# Patient Record
Sex: Female | Born: 1940 | Race: White | Hispanic: No | Marital: Married | Smoking: Never smoker
Health system: Southern US, Community
[De-identification: ages and names within clinical notes are randomized; demographics above are authoritative.]

## PROBLEM LIST (undated history)

## (undated) DIAGNOSIS — Z853 Personal history of malignant neoplasm of breast: Secondary | ICD-10-CM

## (undated) DIAGNOSIS — C73 Malignant neoplasm of thyroid gland: Secondary | ICD-10-CM

## (undated) DIAGNOSIS — I10 Essential (primary) hypertension: Secondary | ICD-10-CM

## (undated) DIAGNOSIS — C50919 Malignant neoplasm of unspecified site of unspecified female breast: Secondary | ICD-10-CM

## (undated) DIAGNOSIS — M810 Age-related osteoporosis without current pathological fracture: Secondary | ICD-10-CM

## (undated) DIAGNOSIS — F411 Generalized anxiety disorder: Principal | ICD-10-CM

## (undated) DIAGNOSIS — E039 Hypothyroidism, unspecified: Secondary | ICD-10-CM

## (undated) DIAGNOSIS — Z8639 Personal history of other endocrine, nutritional and metabolic disease: Secondary | ICD-10-CM

## (undated) DIAGNOSIS — E890 Postprocedural hypothyroidism: Secondary | ICD-10-CM

## (undated) DIAGNOSIS — Z90710 Acquired absence of both cervix and uterus: Secondary | ICD-10-CM

## (undated) DIAGNOSIS — F419 Anxiety disorder, unspecified: Secondary | ICD-10-CM

## (undated) HISTORY — PX: CATARACT EXTRACTION, BILATERAL: SHX1313

## (undated) HISTORY — DX: Personal history of other endocrine, nutritional and metabolic disease: Z86.39

## (undated) HISTORY — PX: HYSTERECTOMY ABDOMINAL WITH SALPINGECTOMY: SHX6725

## (undated) HISTORY — PX: OOPHORECTOMY: SHX86

## (undated) HISTORY — PX: TOTAL SHOULDER REPLACEMENT: SUR1217

## (undated) HISTORY — PX: THYROID SURGERY: SHX805

## (undated) HISTORY — DX: Malignant neoplasm of unspecified site of unspecified female breast: C50.919

## (undated) HISTORY — DX: Hypothyroidism, unspecified: E03.9

## (undated) HISTORY — PX: OTHER SURGICAL HISTORY: SHX169

## (undated) HISTORY — DX: Acquired absence of both cervix and uterus: Z90.710

## (undated) HISTORY — DX: Essential (primary) hypertension: I10

## (undated) HISTORY — DX: Anxiety disorder, unspecified: F41.9

## (undated) HISTORY — DX: Personal history of malignant neoplasm of breast: Z85.3

## (undated) HISTORY — DX: Age-related osteoporosis without current pathological fracture: M81.0

## (undated) HISTORY — PX: NERVE REPAIR: SHX2083

## (undated) HISTORY — DX: Malignant neoplasm of thyroid gland: C73

## (undated) HISTORY — DX: Postprocedural hypothyroidism: E89.0

## (undated) HISTORY — DX: Generalized anxiety disorder: F41.1

## (undated) HISTORY — PX: APPENDECTOMY: SHX54

---

## 2016-05-16 DIAGNOSIS — M25569 Pain in unspecified knee: Secondary | ICD-10-CM | POA: Insufficient documentation

## 2017-08-24 DIAGNOSIS — M2141 Flat foot [pes planus] (acquired), right foot: Secondary | ICD-10-CM | POA: Diagnosis not present

## 2017-08-24 DIAGNOSIS — M76821 Posterior tibial tendinitis, right leg: Secondary | ICD-10-CM | POA: Diagnosis not present

## 2017-08-28 DIAGNOSIS — M810 Age-related osteoporosis without current pathological fracture: Secondary | ICD-10-CM | POA: Diagnosis not present

## 2017-08-28 DIAGNOSIS — M1990 Unspecified osteoarthritis, unspecified site: Secondary | ICD-10-CM | POA: Diagnosis not present

## 2017-08-28 DIAGNOSIS — E890 Postprocedural hypothyroidism: Secondary | ICD-10-CM | POA: Diagnosis not present

## 2017-08-28 DIAGNOSIS — C50919 Malignant neoplasm of unspecified site of unspecified female breast: Secondary | ICD-10-CM | POA: Diagnosis not present

## 2017-08-28 DIAGNOSIS — Z9013 Acquired absence of bilateral breasts and nipples: Secondary | ICD-10-CM | POA: Diagnosis not present

## 2017-08-28 DIAGNOSIS — Z791 Long term (current) use of non-steroidal anti-inflammatories (NSAID): Secondary | ICD-10-CM | POA: Diagnosis not present

## 2017-08-28 DIAGNOSIS — Z79811 Long term (current) use of aromatase inhibitors: Secondary | ICD-10-CM | POA: Diagnosis not present

## 2017-08-28 DIAGNOSIS — Z79899 Other long term (current) drug therapy: Secondary | ICD-10-CM | POA: Diagnosis not present

## 2017-08-28 DIAGNOSIS — D0512 Intraductal carcinoma in situ of left breast: Secondary | ICD-10-CM | POA: Diagnosis not present

## 2017-08-28 DIAGNOSIS — Z170 Estrogen receptor positive status [ER+]: Secondary | ICD-10-CM | POA: Diagnosis not present

## 2017-08-28 DIAGNOSIS — Z9882 Breast implant status: Secondary | ICD-10-CM | POA: Diagnosis not present

## 2017-08-28 DIAGNOSIS — I10 Essential (primary) hypertension: Secondary | ICD-10-CM | POA: Diagnosis not present

## 2017-08-28 DIAGNOSIS — Z85850 Personal history of malignant neoplasm of thyroid: Secondary | ICD-10-CM | POA: Diagnosis not present

## 2017-10-07 DIAGNOSIS — E890 Postprocedural hypothyroidism: Secondary | ICD-10-CM | POA: Diagnosis not present

## 2017-10-07 DIAGNOSIS — K219 Gastro-esophageal reflux disease without esophagitis: Secondary | ICD-10-CM | POA: Diagnosis not present

## 2017-10-07 DIAGNOSIS — I10 Essential (primary) hypertension: Secondary | ICD-10-CM | POA: Diagnosis not present

## 2017-10-07 DIAGNOSIS — D0500 Lobular carcinoma in situ of unspecified breast: Secondary | ICD-10-CM | POA: Diagnosis not present

## 2017-10-07 DIAGNOSIS — E041 Nontoxic single thyroid nodule: Secondary | ICD-10-CM | POA: Diagnosis not present

## 2017-10-07 DIAGNOSIS — Z6828 Body mass index (BMI) 28.0-28.9, adult: Secondary | ICD-10-CM | POA: Diagnosis not present

## 2017-10-27 DIAGNOSIS — R05 Cough: Secondary | ICD-10-CM | POA: Diagnosis not present

## 2017-10-27 DIAGNOSIS — I10 Essential (primary) hypertension: Secondary | ICD-10-CM | POA: Diagnosis not present

## 2017-10-27 DIAGNOSIS — J329 Chronic sinusitis, unspecified: Secondary | ICD-10-CM | POA: Diagnosis not present

## 2017-10-27 DIAGNOSIS — R0982 Postnasal drip: Secondary | ICD-10-CM | POA: Diagnosis not present

## 2017-10-29 DIAGNOSIS — M76821 Posterior tibial tendinitis, right leg: Secondary | ICD-10-CM | POA: Diagnosis not present

## 2017-11-07 DIAGNOSIS — S96911A Strain of unspecified muscle and tendon at ankle and foot level, right foot, initial encounter: Secondary | ICD-10-CM | POA: Diagnosis not present

## 2017-11-11 DIAGNOSIS — M2141 Flat foot [pes planus] (acquired), right foot: Secondary | ICD-10-CM | POA: Diagnosis not present

## 2017-11-11 DIAGNOSIS — M76821 Posterior tibial tendinitis, right leg: Secondary | ICD-10-CM | POA: Diagnosis not present

## 2017-11-30 DIAGNOSIS — J0111 Acute recurrent frontal sinusitis: Secondary | ICD-10-CM | POA: Diagnosis not present

## 2017-11-30 DIAGNOSIS — H9201 Otalgia, right ear: Secondary | ICD-10-CM | POA: Diagnosis not present

## 2017-11-30 DIAGNOSIS — I10 Essential (primary) hypertension: Secondary | ICD-10-CM | POA: Diagnosis not present

## 2017-12-23 DIAGNOSIS — M76821 Posterior tibial tendinitis, right leg: Secondary | ICD-10-CM | POA: Diagnosis not present

## 2017-12-29 DIAGNOSIS — J320 Chronic maxillary sinusitis: Secondary | ICD-10-CM | POA: Diagnosis not present

## 2017-12-29 DIAGNOSIS — H65191 Other acute nonsuppurative otitis media, right ear: Secondary | ICD-10-CM | POA: Diagnosis not present

## 2018-01-20 DIAGNOSIS — H90A21 Sensorineural hearing loss, unilateral, right ear, with restricted hearing on the contralateral side: Secondary | ICD-10-CM | POA: Diagnosis not present

## 2018-01-20 DIAGNOSIS — H903 Sensorineural hearing loss, bilateral: Secondary | ICD-10-CM | POA: Diagnosis not present

## 2018-01-20 DIAGNOSIS — H90A22 Sensorineural hearing loss, unilateral, left ear, with restricted hearing on the contralateral side: Secondary | ICD-10-CM | POA: Diagnosis not present

## 2018-01-20 DIAGNOSIS — M26639 Articular disc disorder of temporomandibular joint, unspecified side: Secondary | ICD-10-CM | POA: Diagnosis not present

## 2018-02-25 DIAGNOSIS — Z79899 Other long term (current) drug therapy: Secondary | ICD-10-CM | POA: Diagnosis not present

## 2018-02-25 DIAGNOSIS — Z79811 Long term (current) use of aromatase inhibitors: Secondary | ICD-10-CM | POA: Diagnosis not present

## 2018-02-25 DIAGNOSIS — M810 Age-related osteoporosis without current pathological fracture: Secondary | ICD-10-CM | POA: Diagnosis not present

## 2018-02-25 DIAGNOSIS — Z9013 Acquired absence of bilateral breasts and nipples: Secondary | ICD-10-CM | POA: Diagnosis not present

## 2018-02-25 DIAGNOSIS — Z9882 Breast implant status: Secondary | ICD-10-CM | POA: Diagnosis not present

## 2018-02-25 DIAGNOSIS — Z85850 Personal history of malignant neoplasm of thyroid: Secondary | ICD-10-CM | POA: Diagnosis not present

## 2018-02-25 DIAGNOSIS — D0512 Intraductal carcinoma in situ of left breast: Secondary | ICD-10-CM | POA: Diagnosis not present

## 2018-02-25 DIAGNOSIS — I10 Essential (primary) hypertension: Secondary | ICD-10-CM | POA: Diagnosis not present

## 2018-02-25 DIAGNOSIS — C50919 Malignant neoplasm of unspecified site of unspecified female breast: Secondary | ICD-10-CM | POA: Diagnosis not present

## 2018-02-25 DIAGNOSIS — Z791 Long term (current) use of non-steroidal anti-inflammatories (NSAID): Secondary | ICD-10-CM | POA: Diagnosis not present

## 2018-02-25 DIAGNOSIS — M1990 Unspecified osteoarthritis, unspecified site: Secondary | ICD-10-CM | POA: Diagnosis not present

## 2018-02-25 DIAGNOSIS — E890 Postprocedural hypothyroidism: Secondary | ICD-10-CM | POA: Diagnosis not present

## 2018-02-25 DIAGNOSIS — M8580 Other specified disorders of bone density and structure, unspecified site: Secondary | ICD-10-CM | POA: Diagnosis not present

## 2018-02-26 DIAGNOSIS — M79671 Pain in right foot: Secondary | ICD-10-CM | POA: Diagnosis not present

## 2018-02-26 DIAGNOSIS — M76821 Posterior tibial tendinitis, right leg: Secondary | ICD-10-CM | POA: Diagnosis not present

## 2018-03-26 DIAGNOSIS — H26493 Other secondary cataract, bilateral: Secondary | ICD-10-CM | POA: Diagnosis not present

## 2018-03-26 DIAGNOSIS — H43391 Other vitreous opacities, right eye: Secondary | ICD-10-CM | POA: Diagnosis not present

## 2018-03-26 DIAGNOSIS — H43811 Vitreous degeneration, right eye: Secondary | ICD-10-CM | POA: Diagnosis not present

## 2018-04-23 DIAGNOSIS — M810 Age-related osteoporosis without current pathological fracture: Secondary | ICD-10-CM | POA: Diagnosis not present

## 2018-04-23 DIAGNOSIS — M1990 Unspecified osteoarthritis, unspecified site: Secondary | ICD-10-CM | POA: Diagnosis not present

## 2018-04-23 DIAGNOSIS — N3281 Overactive bladder: Secondary | ICD-10-CM | POA: Diagnosis not present

## 2018-04-23 DIAGNOSIS — C73 Malignant neoplasm of thyroid gland: Secondary | ICD-10-CM | POA: Diagnosis not present

## 2018-04-23 DIAGNOSIS — C50919 Malignant neoplasm of unspecified site of unspecified female breast: Secondary | ICD-10-CM | POA: Diagnosis not present

## 2018-04-23 DIAGNOSIS — I119 Hypertensive heart disease without heart failure: Secondary | ICD-10-CM | POA: Diagnosis not present

## 2018-04-23 DIAGNOSIS — E559 Vitamin D deficiency, unspecified: Secondary | ICD-10-CM | POA: Diagnosis not present

## 2018-04-23 DIAGNOSIS — E041 Nontoxic single thyroid nodule: Secondary | ICD-10-CM | POA: Diagnosis not present

## 2018-04-23 DIAGNOSIS — E039 Hypothyroidism, unspecified: Secondary | ICD-10-CM | POA: Diagnosis not present

## 2018-05-21 DIAGNOSIS — E210 Primary hyperparathyroidism: Secondary | ICD-10-CM | POA: Diagnosis not present

## 2018-05-21 DIAGNOSIS — E042 Nontoxic multinodular goiter: Secondary | ICD-10-CM | POA: Diagnosis not present

## 2018-05-21 DIAGNOSIS — E890 Postprocedural hypothyroidism: Secondary | ICD-10-CM | POA: Diagnosis not present

## 2018-05-21 DIAGNOSIS — Z6828 Body mass index (BMI) 28.0-28.9, adult: Secondary | ICD-10-CM | POA: Diagnosis not present

## 2018-05-21 DIAGNOSIS — Z85850 Personal history of malignant neoplasm of thyroid: Secondary | ICD-10-CM | POA: Diagnosis not present

## 2018-05-21 DIAGNOSIS — Z08 Encounter for follow-up examination after completed treatment for malignant neoplasm: Secondary | ICD-10-CM | POA: Diagnosis not present

## 2018-05-21 DIAGNOSIS — E559 Vitamin D deficiency, unspecified: Secondary | ICD-10-CM | POA: Diagnosis not present

## 2018-05-21 DIAGNOSIS — C73 Malignant neoplasm of thyroid gland: Secondary | ICD-10-CM | POA: Diagnosis not present

## 2018-05-26 DIAGNOSIS — I119 Hypertensive heart disease without heart failure: Secondary | ICD-10-CM | POA: Diagnosis not present

## 2018-05-26 DIAGNOSIS — F419 Anxiety disorder, unspecified: Secondary | ICD-10-CM | POA: Diagnosis not present

## 2018-05-31 DIAGNOSIS — R197 Diarrhea, unspecified: Secondary | ICD-10-CM | POA: Diagnosis not present

## 2018-05-31 DIAGNOSIS — Z6827 Body mass index (BMI) 27.0-27.9, adult: Secondary | ICD-10-CM | POA: Diagnosis not present

## 2018-05-31 DIAGNOSIS — R112 Nausea with vomiting, unspecified: Secondary | ICD-10-CM | POA: Diagnosis not present

## 2018-06-15 DIAGNOSIS — Z23 Encounter for immunization: Secondary | ICD-10-CM | POA: Diagnosis not present

## 2018-07-20 DIAGNOSIS — Z8639 Personal history of other endocrine, nutritional and metabolic disease: Secondary | ICD-10-CM | POA: Diagnosis not present

## 2018-07-20 DIAGNOSIS — C73 Malignant neoplasm of thyroid gland: Secondary | ICD-10-CM

## 2018-07-20 DIAGNOSIS — E890 Postprocedural hypothyroidism: Secondary | ICD-10-CM | POA: Diagnosis not present

## 2018-07-20 DIAGNOSIS — Z85850 Personal history of malignant neoplasm of thyroid: Secondary | ICD-10-CM | POA: Insufficient documentation

## 2018-07-20 DIAGNOSIS — M810 Age-related osteoporosis without current pathological fracture: Secondary | ICD-10-CM | POA: Diagnosis not present

## 2018-07-20 HISTORY — DX: Malignant neoplasm of thyroid gland: C73

## 2018-07-20 HISTORY — DX: Personal history of other endocrine, nutritional and metabolic disease: Z86.39

## 2018-08-02 ENCOUNTER — Ambulatory Visit (INDEPENDENT_AMBULATORY_CARE_PROVIDER_SITE_OTHER): Payer: Medicare Other | Admitting: Osteopathic Medicine

## 2018-08-02 ENCOUNTER — Encounter: Payer: Self-pay | Admitting: Osteopathic Medicine

## 2018-08-02 VITALS — BP 136/77 | HR 92 | Temp 98.4°F | Ht 63.0 in | Wt 161.6 lb

## 2018-08-02 DIAGNOSIS — Z853 Personal history of malignant neoplasm of breast: Secondary | ICD-10-CM

## 2018-08-02 DIAGNOSIS — E890 Postprocedural hypothyroidism: Secondary | ICD-10-CM

## 2018-08-02 DIAGNOSIS — F411 Generalized anxiety disorder: Secondary | ICD-10-CM

## 2018-08-02 DIAGNOSIS — Z90710 Acquired absence of both cervix and uterus: Secondary | ICD-10-CM

## 2018-08-02 DIAGNOSIS — M810 Age-related osteoporosis without current pathological fracture: Secondary | ICD-10-CM

## 2018-08-02 DIAGNOSIS — Z23 Encounter for immunization: Secondary | ICD-10-CM

## 2018-08-02 MED ORDER — ESCITALOPRAM OXALATE 5 MG PO TABS: 5.0000 mg | ORAL_TABLET | Freq: Every day | ORAL | 1 refills | Status: DC

## 2018-08-02 NOTE — Progress Notes (Signed)
HPI: Barbara Simpson is a 77 y.o. female who  has a past medical history of Anxiety, Breast cancer ([place]), High blood pressure, and Hypothyroidism.  she presents to [facility] today, 08/02/18,  for chief complaint of: New to establish Anxiety/Depression   Concern for anxiety and feeling on edge, some depression as well. See below for GAD/PHQ. Previously on Xanax but would like to avoid anything habit-forming.   Depression screen PHQ 2/9 08/02/2018  Decreased Interest 3  Down, Depressed, Hopeless 1  PHQ - 2 Score 4  Altered sleeping 2  Tired, decreased energy 3  Change in appetite 1  Feeling bad or failure about yourself  2  Trouble concentrating 1  Moving slowly or fidgety/restless 0  Suicidal thoughts 0  PHQ-9 Score 13  Difficult doing work/chores Somewhat difficult   GAD 7 : Generalized Anxiety Score 08/02/2018  Nervous, Anxious, on Edge 2  Control/stop worrying 3  Worry too much - different things 3  Trouble relaxing 2  Restless 1  Easily annoyed or irritable 2  Afraid - awful might happen 0  Total GAD 7 Score 13  Anxiety Difficulty Somewhat difficult      Hypothyroid: stable on Synthroid  Hx breast cancer: taking anastrozole  Hx HTN: taking carvedilol   Other supplements: D3, Mg, B12  Constipation: takes stool softeners daily   Never smoker.   S/p hysterectomy.   Drug intolerances: Xylocaine/Epi caused, Sulfa causes rash, PCN causes headaches, Risedronate caused skin growth, Paxil caused palpitations.        Past medical, surgical, social and family history reviewed:  Patient Active Problem List   Diagnosis Date Noted  . Generalized anxiety disorder 08/03/2018  . 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 History of breast cancer 08/03/2018  . Age-related osteoporosis without current pathological fracture 08/03/2018  . H/O total hysterectomy 08/03/2018  . History of primary hyperparathyroidism 07/20/2018  .  Thyroid cancer ([place]) 07/20/2018    Past Surgical History:  Procedure Laterality Date  . APPENDECTOMY    . BROKEN BONE REPAIR    . CATARACT EXTRACTION, BILATERAL    . HYSTERECTOMY ABDOMINAL WITH SALPINGECTOMY    . NERVE REPAIR     LEFT ARM  . OOPHORECTOMY    . THYROID SURGERY    . TOTAL SHOULDER REPLACEMENT Right     Social History   Tobacco Use  . Smoking status: Never Smoker  . Smokeless tobacco: Never Used  Substance Use Topics  . Alcohol use: Never    Frequency: Never    History reviewed. No pertinent family history.   Current medication list and allergy/intolerance information reviewed:    Current Outpatient Medications  Medication Sig Dispense Refill  . anastrozole (ARIMIDEX) 1 MG tablet Take by mouth.    . carvedilol (COREG) 12.5 MG tablet Take by mouth.    . Cholecalciferol (VITAMIN D3) 50 MCG (2000 UT) TABS Take by mouth.    . docusate sodium (COLACE) 100 MG capsule Take by mouth.    . levothyroxine (SYNTHROID, LEVOTHROID) 50 MCG tablet Take by mouth.    . magnesium oxide (MAG-OX) 400 MG tablet Take by mouth.    . vitamin B-12 (CYANOCOBALAMIN) 1000 MCG tablet Take by mouth.       30 tablet 1   No current facility-administered medications for this visit.     Allergies  Allergen Reactions  . Lidocaine-Epinephrine Other (See Comments)    Patient states "nearly died"  . Paroxetine Palpitations  . Penicillins Other (See Comments)  Headache, blood pressure drops  . Sulfa Antibiotics Rash    Blotches  . Risedronate     Skin growths      Review of Systems:  Constitutional:  No  fever, no chills, No recent illness, No unintentional weight changes. +significant fatigue.   HEENT: No  headache, no vision change, no hearing change, No sore throat, No  sinus pressure  Cardiac: No  chest pain, No  pressure, No palpitations, No  Orthopnea  Respiratory:  No  shortness of breath. No  Cough  Gastrointestinal: No  abdominal pain, No  nausea, No  vomiting,  No   blood in stool, No  diarrhea, No  constipation   Musculoskeletal: No new myalgia/arthralgia  Skin: No  Rash, No other wounds/concerning lesions  Genitourinary: No  incontinence, No  abnormal genital bleeding, No abnormal genital discharge  Hem/Onc: No  easy bruising/bleeding  Endocrine: No cold intolerance,  No heat intolerance. No polyuria/polydipsia/polyphagia   Neurologic: No  weakness, No  dizziness, No  slurred speech/focal weakness/facial droop  Psychiatric: +concerns with depression, +concerns with anxiety, No sleep problems, No mood problems  Exam:  BP 136/77 (BP Location: Left Arm, Patient Position: Sitting, Cuff Size: Normal)   Pulse 92   Temp 98.4 F (36.9 C) (Oral)   Ht 5\' 3"  (1.6 m)   Wt 161 lb 9.6 oz (73.3 kg)   BMI 28.63 kg/m   Constitutional: VS see above. General Appearance: alert, well-developed, well-nourished, NAD  Eyes: Normal lids and conjunctive, non-icteric sclera  Ears, Nose, Mouth, Throat: MMM, Normal external inspection ears/nares/mouth/lips/gums. TM normal bilaterally. Pharynx/tonsils no erythema, no exudate. Nasal mucosa normal.   Neck: No masses, trachea midline. No thyroid enlargement. No tenderness/mass appreciated. No lymphadenopathy  Respiratory: Normal respiratory effort. no wheeze, no rhonchi, no rales  Cardiovascular: S1/S2 normal, no murmur, no rub/gallop auscultated. RRR. No lower extremity edem  Gastrointestinal: Nontender, no masses. No hepatomegaly, no splenomegaly. No hernia appreciated. Bowel sounds normal. Rectal exam deferred.   Musculoskeletal: Gait normal. No clubbing/cyanosis of digits.   Neurological: Normal balance/coordination. No tremor. No cranial nerve deficit on limited exam. Motor and sensation intact and symmetric. Cerebellar reflexes intact.   Skin: warm, dry, intact. No rash/ulcer. No concerning nevi or subq nodules on limited exam.    Psychiatric: Normal judgment/insight. Normal mood and affect. Oriented x3.       ASSESSMENT/PLAN: The primary encounter diagnosis was Generalized anxiety disorder. Diagnoses of Need for shingles vaccine, Postoperative hypothyroidism, History of breast cancer, Age-related osteoporosis without current pathological fracture, and H/O total hysterectomy were also pertinent to this visit.   Orders Placed This Encounter  Procedures  . Ambulatory referral to Hematology / Oncology    Meds ordered this encounter  Medications  . escitalopram (LEXAPRO) 5 MG tablet    Sig: Take 1 tablet (5 mg total) by mouth at bedtime.    Dispense:  30 tablet    Refill:  1  . anastrozole (ARIMIDEX) 1 MG tablet    Sig: Take 1 tablet (1 mg total) by mouth daily.    Dispense:  90 tablet    Refill:  3  . carvedilol (COREG) 12.5 MG tablet    Sig: Take 1 tablet (12.5 mg total) by mouth 2 (two) times daily with a meal.    Dispense:  180 tablet    Refill:  3  . Cholecalciferol (VITAMIN D3) 50 MCG (2000 UT) TABS    Sig: Take 50 mcg by mouth daily.    Dispense:  90 tablet  Refill:  3  . docusate sodium (COLACE) 100 MG capsule    Sig: Take 1 capsule (100 mg total) by mouth daily.    Dispense:  90 capsule    Refill:  3  . levothyroxine (SYNTHROID, LEVOTHROID) 50 MCG tablet    Sig: Take 1 tablet (50 mcg total) by mouth daily before breakfast.    Dispense:  90 tablet    Refill:  3    Patient Instructions  Will try Lexapro low dose for anxiety Let's see you back in the office in about a month to recheck on this medicine See me or call me sooner if needed!  Will refer to oncology.  For orthopedic issues: please schedule with one of our sports medicine doctors.  Continue follow-up with endocrinologist as directed           Visit summary with medication list and pertinent instructions was printed for patient to review. All questions at time of visit were answered - patient instructed to contact office with any additional concerns or updates. ER/RTC precautions were reviewed  with the patient.    Please note: voice recognition software was used to produce this document, and typos may escape review. Please contact Dr. Sheppard Coil for any needed clarifications.     Follow-up plan: Return in about 4 weeks (around 08/30/2018) for recheck anxiety on Lexapro, see me sooner if needed.

## 2018-08-02 NOTE — Patient Instructions (Addendum)
Will try Lexapro low dose for anxiety Let's see you back in the office in about a month to recheck on this medicine See me or call me sooner if needed!  Will refer to oncology.  For orthopedic issues: please schedule with one of our sports medicine doctors.  Continue follow-up with endocrinologist as directed

## 2018-08-03 ENCOUNTER — Encounter: Payer: Self-pay | Admitting: Osteopathic Medicine

## 2018-08-03 ENCOUNTER — Telehealth: Payer: Self-pay | Admitting: Hematology & Oncology

## 2018-08-03 DIAGNOSIS — Z90710 Acquired absence of both cervix and uterus: Secondary | ICD-10-CM

## 2018-08-03 DIAGNOSIS — Z853 Personal history of malignant neoplasm of breast: Secondary | ICD-10-CM

## 2018-08-03 DIAGNOSIS — E890 Postprocedural hypothyroidism: Secondary | ICD-10-CM

## 2018-08-03 DIAGNOSIS — M810 Age-related osteoporosis without current pathological fracture: Secondary | ICD-10-CM | POA: Insufficient documentation

## 2018-08-03 DIAGNOSIS — F411 Generalized anxiety disorder: Secondary | ICD-10-CM

## 2018-08-03 HISTORY — DX: Acquired absence of both cervix and uterus: Z90.710

## 2018-08-03 HISTORY — DX: Personal history of malignant neoplasm of breast: Z85.3

## 2018-08-03 HISTORY — DX: Postprocedural hypothyroidism: E89.0

## 2018-08-03 HISTORY — DX: Generalized anxiety disorder: F41.1

## 2018-08-03 HISTORY — DX: Age-related osteoporosis without current pathological fracture: M81.0

## 2018-08-03 MED ORDER — CARVEDILOL 12.5 MG PO TABS: 12.5000 mg | ORAL_TABLET | Freq: Two times a day (BID) | ORAL | 3 refills | Status: DC

## 2018-08-03 MED ORDER — ANASTROZOLE 1 MG PO TABS: 1.0000 mg | ORAL_TABLET | Freq: Every day | ORAL | 3 refills | Status: DC

## 2018-08-03 MED ORDER — VITAMIN D3 50 MCG (2000 UT) PO TABS: 50.0000 ug | ORAL_TABLET | Freq: Every day | ORAL | 3 refills | Status: AC

## 2018-08-03 MED ORDER — DOCUSATE SODIUM 100 MG PO CAPS: 100.0000 mg | ORAL_CAPSULE | Freq: Every day | ORAL | 3 refills | Status: DC

## 2018-08-03 MED ORDER — LEVOTHYROXINE SODIUM 50 MCG PO TABS: 50.0000 ug | ORAL_TABLET | Freq: Every day | ORAL | 3 refills | Status: DC

## 2018-08-03 NOTE — Telephone Encounter (Signed)
Spoke with patient to confirm new patient appt 09/02/18 at 1030 am

## 2018-08-05 ENCOUNTER — Ambulatory Visit (INDEPENDENT_AMBULATORY_CARE_PROVIDER_SITE_OTHER): Payer: Medicare Other

## 2018-08-05 ENCOUNTER — Encounter: Payer: Self-pay | Admitting: Family Medicine

## 2018-08-05 ENCOUNTER — Ambulatory Visit (INDEPENDENT_AMBULATORY_CARE_PROVIDER_SITE_OTHER): Payer: Medicare Other | Admitting: Family Medicine

## 2018-08-05 VITALS — BP 133/65 | HR 78 | Ht 64.0 in | Wt 162.0 lb

## 2018-08-05 DIAGNOSIS — M25561 Pain in right knee: Secondary | ICD-10-CM | POA: Diagnosis not present

## 2018-08-05 DIAGNOSIS — M25572 Pain in left ankle and joints of left foot: Secondary | ICD-10-CM

## 2018-08-05 DIAGNOSIS — G8929 Other chronic pain: Secondary | ICD-10-CM

## 2018-08-05 DIAGNOSIS — M1711 Unilateral primary osteoarthritis, right knee: Secondary | ICD-10-CM

## 2018-08-05 NOTE — Progress Notes (Signed)
Subjective:    CC: Right knee and left foot pain  HPI: Barbara Simpson is a 77 yr old woman, with a past medical history of breast and thyroid cancer and GAD, who presents today for right knee and left foot pain. She recently moved here from the [place] area.   Right knee: She had been going to Dr. Yehuda Savannah at [place] where she received a series of 3 hyaluronic acid injections about 20 months ago. She feels it "wearing off". She feels popping and clicking when she walks and is only able to take one step at a time when she takes stairs. She has previously had knee x-rays and MRI, and she recalls that they showed loss of cartilage. She would like to have more injections of hyaluronic acid.  She also complains of lateral left foot pain that started recently. She has been wearing a copper compression sock, as recommended by Dr. Yehuda Savannah. Her pain is controlled when she wears the sock, but when she doesn't, she feels a pulling sensation across the lateral side of her foot. She also notes sharp shooting pains on the bottom of her foot that occur at any time. This interferes with her daily life and prevents her from being as active as she would like to be.   Past medical history, Surgical history, Family history not pertinant except as noted below, Social history, Allergies, and medications have been entered into the medical record, reviewed, and no changes needed.   Review of Systems: No headache, visual changes, nausea, vomiting, diarrhea, constipation, dizziness, abdominal pain, skin rash, fevers, chills, night sweats, weight loss, swollen lymph nodes, body aches, joint swelling, muscle aches, chest pain, shortness of breath, mood changes, visual or auditory hallucinations.   Objective:    Vitals:   08/05/18 1017  BP: 133/65  Pulse: 78   General: Well Developed, well nourished, and in no acute distress.  Neuro/Psych: Alert and oriented x3, extra-ocular muscles intact, able to move  all 4 extremities, sensation grossly intact. Skin: Warm and dry, no rashes noted.  Respiratory: Not using accessory muscles, speaking in full sentences, trachea midline.  Cardiovascular: Pulses palpable, no extremity edema. Abdomen: Does not appear distended. MSK:  Right knee:  Normal-appearing.  Tender to palpation on the medial side of the patella. Limited ROM due to pain. Crepitations with range of motion 5/5 strength with knee flexion. Positive McMurray test. Negative anterior and posterior drawer tests.  Pulses and capillary refill normal.  Left foot:  Normal-appearing.  Tender to palpation below lateral epicondyle. Limited ROM due to pain.  4/5 strength with plantar and dorsiflexion, limited to pain. 4/5 strength with eversion and inversion.  Pulses and capillary refill normal.  Lab and Radiology Results X-ray images left ankle reveal no acute fractures.  Mild degenerative changes.  Images personally independently reviewed.  Await formal radiology review.  Impression and Recommendations:    Assessment and Plan: 77 y.o. female with  Right knee pain due to osteoarthritis.  Will plan for hyaluronic acid injections. Contact her insurance to confirm which brand they pay for. [facility] for records and previous images.  Left foot pain: Left foot x-ray did not reveal any obvious fractures or degenerative changes. Advised pt to use an ankle compression sleeve and to use topical aspercream as needed. Follow-up in a few weeks. If not improving, do cortisone injection.   Orders Placed This Encounter  Procedures  . DG Ankle Complete Left    Standing Status:  Future    Number of Occurrences:   1    Standing Expiration Date:   10/07/2019    Order Specific Question:   Reason for Exam (SYMPTOM  OR DIAGNOSIS REQUIRED)    Answer:   eval left lateral ankle pain    Order Specific Question:   Preferred imaging location?    Answer:   [facility]    Order Specific  Question:   Radiology Contrast Protocol - do NOT remove file path    Answer:   [email or web address]   No orders of the defined types were placed in this encounter.   Discussed warning signs or symptoms. Please see discharge instructions. Patient expresses understanding.  I personally was present and performed or re-performed the history, physical exam and medical decision-making activities of this service and have verified that the service and findings are accurately documented in the student's note. ___________________________________________ Lynne Leader M.D., ABFM., CAQSM. Primary Care and Sports Medicine Adjunct Instructor of [employer]

## 2018-08-05 NOTE — Patient Instructions (Signed)
Thank you for coming in today. Use topical aspercream.  Use the compression sleeve.  Consider Body Helix Full Ankle.  Get xray now.  Recheck in a few weeks. If not improving it will make sense to do a steroid (cortisone) shot.   As for the right knee we will proceed with the Gel Shots. (rooster comb).  You should hear about that soon.

## 2018-08-06 ENCOUNTER — Telehealth: Payer: Self-pay | Admitting: Family Medicine

## 2018-08-06 NOTE — Telephone Encounter (Signed)
-----   Message from Tasia Catchings, [place] sent at 08/06/2018  9:15 AM EST ----- Regarding: FW: [facility]ov[facility] Information has been submitted to [facility] and awaiting determination.   ----- Message ----- From: Gregor Hams, MD Sent: 08/05/2018  12:48 PM EST To: Tasia Catchings, CMA Subject: Orthovisc                                      Please prior Auth Orthovisc or other equivalent hyaluronic acid.  Right knee injection.

## 2018-08-09 NOTE — Telephone Encounter (Signed)
Orthovisc is covered. There is no copay. The deductible has been met, and per Medicare's guideline the patient's responsibility is 20% of the allowable amount. The out of pocket does not apply. Per Portal.  Left message for patient to call back about injections.

## 2018-08-12 NOTE — Telephone Encounter (Signed)
Patient is agreeable to the estimated cost of the knee injections. She has an appointment on 08/17/2018 for first right knee injection.

## 2018-08-17 ENCOUNTER — Ambulatory Visit: Payer: Medicare Other | Admitting: Family Medicine

## 2018-08-30 ENCOUNTER — Encounter: Payer: Self-pay | Admitting: Osteopathic Medicine

## 2018-08-30 ENCOUNTER — Ambulatory Visit (INDEPENDENT_AMBULATORY_CARE_PROVIDER_SITE_OTHER): Payer: Medicare Other | Admitting: Osteopathic Medicine

## 2018-08-30 VITALS — BP 130/79 | HR 93 | Temp 97.8°F | Wt 161.0 lb

## 2018-08-30 DIAGNOSIS — Z8659 Personal history of other mental and behavioral disorders: Secondary | ICD-10-CM | POA: Diagnosis not present

## 2018-08-30 DIAGNOSIS — M1711 Unilateral primary osteoarthritis, right knee: Secondary | ICD-10-CM

## 2018-08-30 NOTE — Patient Instructions (Signed)
Will have you back for Medicare Wellness Visit later this year with our nurse Maudie Mercury. And you can come see me same day for physical exam and medical check-up. Otherwise, see [place] as needed! Dr. Georgina Snell and I are here if you need [place]!  I'll look into the Ortho[facility]isc as well.

## 2018-08-30 NOTE — Progress Notes (Signed)
HPI: Barbara Simpson is a 78 y.o. female who  has a past medical history of Age-related osteoporosis without current pathological fracture (08/03/2018), Anxiety, Breast cancer ([place]), Generalized anxiety disorder (08/03/2018), H/O total hysterectomy (08/03/2018), High blood pressure, History of breast cancer (08/03/2018), History of primary hyperparathyroidism (07/20/2018), Hypothyroidism, Postoperative hypothyroidism (08/03/2018), and Thyroid cancer ([place]) (07/20/2018).  she presents to [facility] today, 08/30/18,  for chief complaint of:  Follow-up anxiety   Started Lexapro at low dose 5 mg about a month ago to treat anxiety. Previously on Xanax but wanted to avoid controlled substances.   Pt stopped Lexapro. Slept great but felt quite foggy on the medicine. Stress has subsided a god bit and mentally she feels well.    Depression screen [facility] 2/9 08/30/2018 08/02/2018  Decreased Interest 0 3  Down, Depressed, Hopeless 0 1  PHQ - 2 Score 0 4  Altered sleeping 0 2  Tired, decreased energy 2 3  Change in appetite 0 1  Feeling bad or failure about yourself  0 2  Trouble concentrating 0 1  Moving slowly or fidgety/restless 0 0  Suicidal thoughts - 0  PHQ-9 Score 2 13  Difficult doing work/chores Not difficult at all Somewhat difficult   GAD 7 : Generalized Anxiety Score 08/30/2018 08/02/2018  Nervous, Anxious, on Edge 0 2  Control/stop worrying 1 3  Worry too much - different things 0 3  Trouble relaxing 0 2  Restless 0 1  Easily annoyed or irritable 0 2  Afraid - awful might happen 0 0  Total GAD 7 Score 1 13  Anxiety Difficulty Not difficult at all Somewhat difficult        At today's visit... Past medical history, surgical history, and family history reviewed and updated as needed.  Current medication list and allergy/intolerance information reviewed and updated as needed. (See remainder of HPI, ROS, Phys Exam  below)          ASSESSMENT/PLAN: The primary encounter diagnosis was History of anxiety. A diagnosis of Primary osteoarthritis of right knee was also pertinent to this visit.   Patient Instructions  Will have you back for Medicare Wellness Visit later this year with our nurse Maudie Mercury. And you can come see me same day for physical exam and medical check-up. Otherwise, see [place] as needed! Dr. Georgina Snell and I are here if you need [place]!  I'll look into the OrthoVisc as well.        Follow-up plan: Return for Medicare visit this fall w/ Maudie Mercury.                             ############################################ ############################################ ############################################ ############################################    No outpatient medications have been marked as taking for the 08/30/18 encounter (Appointment) with Emeterio Reeve, DO.    Allergies  Allergen Reactions  . Lidocaine-Epinephrine Other (See Comments)    Patient states "nearly died"  . Paroxetine Palpitations  . Penicillins Other (See Comments)    Headache, blood pressure drops  . Sulfa Antibiotics Rash    Blotches  . Risedronate     Skin growths       Review of Systems:  Constitutional: No recent illness  HEENT: No  headache, no vision change  Cardiac: No  chest pain, No  pressure, No palpitations  Respiratory:  No  shortness of breath. No  Cough  Neurologic: No  weakness, No  Dizziness  Psychiatric: No  concerns with  depression, No  concerns with anxiety  Exam:  BP 130/79   Pulse 93   Temp 97.8 F (36.6 C) (Oral)   Wt 161 lb (73 kg)   BMI 27.64 kg/m   Constitutional: VS see above. General Appearance: alert, well-developed, well-nourished, NAD  Eyes: Normal lids and conjunctive, non-icteric sclera  Ears, Nose, Mouth, Throat: MMM, Normal external inspection ears/nares/mouth/lips/gums.  Neck: No masses, trachea midline.    Respiratory: Normal respiratory effort.   Musculoskeletal: Gait normal. Symmetric and independent movement of all extremities  Neurological: Normal balance/coordination. No tremor.  Skin: warm, dry, intact.   Psychiatric: Normal judgment/insight. Normal mood and affect. Oriented x3.       Visit summary with medication list and pertinent instructions was printed for patient to review, patient was advised to alert [place] if any updates are needed. All questions at time of visit were answered - patient instructed to contact office with any additional concerns. ER/RTC precautions were reviewed with the patient and understanding verbalized.   Note: Total time spent 15 minutes, greater than 50% of the visit was spent face-to-face counseling and coordinating care for the following: The primary encounter diagnosis was History of anxiety. A diagnosis of Primary osteoarthritis of right knee was also pertinent to this visit.Marland Kitchen  Please note: voice recognition software was used to produce this document, and typos may escape review. Please contact Dr. Sheppard Coil for any needed clarifications.    Follow up plan: Return for Medicare visit this fall w/ Maudie Mercury.

## 2018-09-02 ENCOUNTER — Encounter (INDEPENDENT_AMBULATORY_CARE_PROVIDER_SITE_OTHER): Payer: Self-pay

## 2018-09-02 ENCOUNTER — Inpatient Hospital Stay: Payer: Medicare Other | Attending: Hematology & Oncology | Admitting: Hematology & Oncology

## 2018-09-02 ENCOUNTER — Encounter: Payer: Self-pay | Admitting: Hematology & Oncology

## 2018-09-02 ENCOUNTER — Inpatient Hospital Stay: Payer: Medicare Other

## 2018-09-02 ENCOUNTER — Other Ambulatory Visit: Payer: Self-pay

## 2018-09-02 VITALS — BP 130/59 | HR 79 | Resp 18 | Ht 64.0 in | Wt 159.0 lb

## 2018-09-02 DIAGNOSIS — Z853 Personal history of malignant neoplasm of breast: Secondary | ICD-10-CM | POA: Diagnosis not present

## 2018-09-02 DIAGNOSIS — Z85850 Personal history of malignant neoplasm of thyroid: Secondary | ICD-10-CM

## 2018-09-02 DIAGNOSIS — Z90710 Acquired absence of both cervix and uterus: Secondary | ICD-10-CM | POA: Diagnosis not present

## 2018-09-02 DIAGNOSIS — Z79899 Other long term (current) drug therapy: Secondary | ICD-10-CM

## 2018-09-02 DIAGNOSIS — Z9013 Acquired absence of bilateral breasts and nipples: Secondary | ICD-10-CM

## 2018-09-02 DIAGNOSIS — L820 Inflamed seborrheic keratosis: Secondary | ICD-10-CM

## 2018-09-02 LAB — CMP (CANCER CENTER ONLY)
ALT: 8 U/L (ref 0–44)
AST: 9 U/L — ABNORMAL LOW (ref 15–41)
Albumin: 4.2 g/dL (ref 3.5–5.0)
Alkaline Phosphatase: 88 U/L (ref 38–126)
Anion gap: 7 (ref 5–15)
BUN: 11 mg/dL (ref 8–23)
CO2: 32 mmol/L (ref 22–32)
Calcium: 8.8 mg/dL — ABNORMAL LOW (ref 8.9–10.3)
Chloride: 95 mmol/L — ABNORMAL LOW (ref 98–111)
Creatinine: 0.74 mg/dL (ref 0.44–1.00)
GFR, Est AFR Am: >60 mL/min (ref >60)
GFR, Estimated: >60 mL/min (ref >60)
Glucose, Bld: 130 mg/dL — ABNORMAL HIGH (ref 70–99)
Potassium: 4.3 mmol/L (ref 3.5–5.1)
Sodium: 134 mmol/L — ABNORMAL LOW (ref 135–145)
Total Bilirubin: 0.4 mg/dL (ref 0.3–1.2)
Total Protein: 6.6 g/dL (ref 6.5–8.1)

## 2018-09-02 LAB — CBC WITH DIFFERENTIAL (CANCER CENTER ONLY)
Abs Immature Granulocytes: 0.1 10*3/uL — ABNORMAL HIGH (ref 0.00–0.07)
Basophils Absolute: 0.1 10*3/uL (ref 0.0–0.1)
Basophils Relative: 1 %
Eosinophils Absolute: 0.2 10*3/uL (ref 0.0–0.5)
Eosinophils Relative: 3 %
HCT: 39.4 % (ref 36.0–46.0)
Hemoglobin: 12.4 g/dL (ref 12.0–15.0)
Immature Granulocytes: 2 %
Lymphocytes Relative: 16 %
Lymphs Abs: 1.1 10*3/uL (ref 0.7–4.0)
MCH: 27 pg (ref 26.0–34.0)
MCHC: 31.5 g/dL (ref 30.0–36.0)
MCV: 85.8 fL (ref 80.0–100.0)
Monocytes Absolute: 0.5 10*3/uL (ref 0.1–1.0)
Monocytes Relative: 8 %
Neutro Abs: 4.9 10*3/uL (ref 1.7–7.7)
Neutrophils Relative %: 70 %
Platelet Count: 298 10*3/uL (ref 150–400)
RBC: 4.59 MIL/uL (ref 3.87–5.11)
RDW: 14.3 % (ref 11.5–15.5)
WBC Count: 6.8 10*3/uL (ref 4.0–10.5)
nRBC: 0 % (ref 0.0–0.2)

## 2018-09-02 LAB — LACTATE DEHYDROGENASE: LDH: 107 U/L (ref 98–192)

## 2018-09-02 NOTE — Progress Notes (Signed)
Referral MD  Reason for Referral: Breast cancer of the left breast-no information available  Chief Complaint  Patient presents with  . New Patient (Initial Visit)  : I had cancer in my left breast.  HPI: Barbara Simpson is a very nice 78 year old white female.  She is originally from [place].  She has moved quite a bit.  She was down in [place] and then she and her husband moved up to [facility].  She now has moved to the area as her children live here.  She has had multiple surgeries.  She has had breast cancer.  She really cannot give me much information about the breast cancer.  She says it was in the left breast.  She had surgery done in [place] back in 2014.  She said that she did not need any kind of chemotherapy or radiation therapy.  She actually had bilateral mastectomy.  She currently is on Arimidex.  She is done well with Arimidex.  It is causing some issues with her bones.  Since she has been on it now for 5 years, we will get her off the Arimidex.  She said that her oncologist down in [place] says she only needed for 5 years.  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  She had a mastectomy for this.  She has had multiple surgeries for multiple health issues.  She is followed by Dr. Sheppard Coil.  Dr. Sheppard Coil kindly referred her to the [facility] for an oncology surveillance.  Barbara Simpson is fun to talk to.  She is quite energetic.  She tries to stay active.  I think her husband has health issues and he is not doing all that well.  She has had no change in bowel or bladder habits.  She has had no cough.  She does not smoke.  She does not drink.  She does not work currently.  She does not have any kind of occupational exposures.  She had a hysterectomy with her ovaries removed back when she was in her 70s.  She was on estrogen therapy for many years afterwards.  I am sure this probably is the reason for her to have had the  breast cancer.  She has had no rashes.  She has had no issues with skin cancer.  Her appetite is good.  She is not a vegetarian.  Overall, her performance status is ECOG 1.     Past Medical History:  Diagnosis Date  . Age-related osteoporosis without current pathological fracture 08/03/2018  . Anxiety   . Breast cancer ([place])   . Generalized anxiety disorder 08/03/2018  . H/O total hysterectomy 08/03/2018  . High blood pressure   . History of breast cancer 08/03/2018  . History of primary hyperparathyroidism 07/20/2018  . Hypothyroidism   . 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 Thyroid cancer ([place]) 07/20/2018  :  Past Surgical History:  Procedure Laterality Date  . APPENDECTOMY    . BROKEN BONE REPAIR    . CATARACT EXTRACTION, BILATERAL    . HYSTERECTOMY ABDOMINAL WITH SALPINGECTOMY    . NERVE REPAIR     LEFT ARM  . OOPHORECTOMY    . THYROID SURGERY    . TOTAL SHOULDER REPLACEMENT Right   :   Current Outpatient Medications:  Marland Kitchen  Melatonin 5 MG TABS, Take 2.5 mg by mouth as needed. Takes 1/2 tablet, total of 2.5 mg as needed at bedtime., Disp: , Rfl:  .  anastrozole (ARIMIDEX) 1 MG tablet, Take 1 tablet (1 mg total) by mouth daily., Disp: 90 tablet, Rfl: 3 .  carvedilol (COREG) 12.5 MG tablet, Take 1 tablet (12.5 mg total) by mouth 2 (two) times daily with a meal., Disp: 180 tablet, Rfl: 3 .  Cholecalciferol (VITAMIN D3) 50 MCG (2000 UT) TABS, Take 50 mcg by mouth daily., Disp: 90 tablet, Rfl: 3 .  docusate sodium (COLACE) 100 MG capsule, Take 1 capsule (100 mg total) by mouth daily., Disp: 90 capsule, Rfl: 3 .  levothyroxine (SYNTHROID, LEVOTHROID) 50 MCG tablet, Take 1 tablet (50 mcg total) by mouth daily before breakfast., Disp: 90 tablet, Rfl: 3 .  magnesium oxide (MAG-OX) 400 MG tablet, Take by mouth., Disp: , Rfl:  .  vitamin B-12 (CYANOCOBALAMIN) 1000 MCG tablet, Take by mouth., Disp: , Rfl: :  :  Allergies  Allergen Reactions  . Lidocaine-Epinephrine  Other (See Comments)    Patient states "nearly died"  . Paroxetine Palpitations  . Penicillins Other (See Comments)    Headache, blood pressure drops  . Sulfa Antibiotics Rash    Blotches  . Xylocaine  [Lidocaine Hcl]   . Risedronate     Skin growths  :  History reviewed. No pertinent family history.:  Social History   Socioeconomic History  . Marital status: Married    Spouse name: Not on file  . Number of children: Not on file  . Years of education: Not on file  . Highest education level: Not on file  Occupational History  . Not on file  Social Needs  . Financial resource strain: Not on file  . Food insecurity:    Worry: Not on file    Inability: Not on file  . Transportation needs:    Medical: Not on file    Non-medical: Not on file  Tobacco Use  . Smoking status: Never Smoker  . Smokeless tobacco: Never Used  Substance and Sexual Activity  . Alcohol use: Never    Frequency: Never  . Drug use: Never  . Sexual activity: Not Currently    Partners: Male  Lifestyle  . Physical activity:    Days per week: Not on file    Minutes per session: Not on file  . Stress: Not on file  Relationships  . Social connections:    Talks on phone: Not on file    Gets together: Not on file    Attends religious service: Not on file    Active member of club or organization: Not on file    Attends meetings of clubs or organizations: Not on file    Relationship status: Not on file  . Intimate partner violence:    Fear of current or ex partner: Not on file    Emotionally abused: Not on file    Physically abused: Not on file    Forced sexual activity: Not on file  Other Topics Concern  . Not on file  Social History Narrative  . Not on file  :  Review of Systems  Constitutional: Negative.   HENT: Negative.   Eyes: Negative.   Respiratory: Negative.   Cardiovascular: Negative.   Gastrointestinal: Negative.   Genitourinary: Negative.   Musculoskeletal: Negative.   Skin:  Negative.   Neurological: Negative.   Endo/Heme/Allergies: Negative.   Psychiatric/Behavioral: Negative.      Exam: Well-developed and well-nourished white female in no obvious distress.  Vital signs show temperature of 98.7.  Pulse 79.  Blood pressure 130/59.  Weight is  159 pounds.  Head neck exam shows no ocular or oral lesions.  She has no palpable cervical or supraclavicular lymph nodes.  Lungs are clear bilaterally.  Cardiac exam regular rate and rhythm with no murmurs, rubs or bruits.  Abdomen is soft.  She has good bowel sounds.  She has laparotomy scars.  She has no palpable liver or spleen tip.  Breast exam shows bilateral mastectomies.  She has implants.  She has no distinct probable chest wall masses.  She has no tenderness over the sternum.  She has no bilateral axillary adenopathy.  Back exam shows no tenderness over the spine, ribs or hips.  Extremities shows no clubbing, cyanosis or edema.  Neurological exam shows no focal neurological deficits.  Skin exam shows some seborrheic keratoses.  She has a large one on the back of her left upper arm that probably needs to be removed. @IPVITALS @   Recent Labs    09/02/18 1028  WBC 6.8  HGB 12.4  HCT 39.4  PLT 298   Recent Labs    09/02/18 1028  NA 134*  K 4.3  CL 95*  CO2 32  GLUCOSE 130*  BUN 11  CREATININE 0.74  CALCIUM 8.8*    Blood smear review: None  Pathology: None    Assessment and Plan: Barbara Simpson is a very charming 78 year old  postmenopausal white female.  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  Again, we have absolutely  no pathologic information.  I am just relying on Barbara Simpson who I think is a very good historian.  I really do not think that she is going to be at high risk for any type of breast cancer recurrence.  She will need to follow-up in 6 months.  Again, we will get her off the Arimidex.  As far as the skin issues go, she needs to have a dermatologist see her.  We will  see about making her a referral to dermatology.  I will plan to get her back in 6 months.  I spent about an hour with her today.  I was face-to-face with her.  I answered all of her questions.  It was fun talking to her about all the places that she has lived.

## 2018-09-03 ENCOUNTER — Telehealth: Payer: Self-pay | Admitting: Hematology & Oncology

## 2018-09-03 LAB — CANCER ANTIGEN 27.29: CA 27.29: 19.3 U/mL (ref 0.0–38.6)

## 2018-09-03 NOTE — Telephone Encounter (Signed)
Faxed records and referral to [facility] at [phone]. Office will contact patient and sch appointments

## 2018-09-13 ENCOUNTER — Telehealth: Payer: Self-pay | Admitting: Hematology & Oncology

## 2018-09-13 NOTE — Telephone Encounter (Signed)
Received call form pt to get phone number of [facility], [facility] re referral to dermatology. Records were faxed to office and gave patient phone number [phone] to call office bact to resch appt

## 2018-09-15 ENCOUNTER — Encounter: Payer: Self-pay | Admitting: Family Medicine

## 2018-09-15 ENCOUNTER — Ambulatory Visit (INDEPENDENT_AMBULATORY_CARE_PROVIDER_SITE_OTHER): Payer: Medicare Other | Admitting: Family Medicine

## 2018-09-15 VITALS — BP 125/98 | HR 78 | Ht 64.0 in | Wt 160.0 lb

## 2018-09-15 DIAGNOSIS — M25561 Pain in right knee: Secondary | ICD-10-CM | POA: Diagnosis not present

## 2018-09-15 DIAGNOSIS — M79672 Pain in left foot: Secondary | ICD-10-CM

## 2018-09-15 DIAGNOSIS — G8929 Other chronic pain: Secondary | ICD-10-CM | POA: Diagnosis not present

## 2018-09-15 DIAGNOSIS — M1711 Unilateral primary osteoarthritis, right knee: Secondary | ICD-10-CM | POA: Diagnosis not present

## 2018-09-15 NOTE — Progress Notes (Signed)
Barbara Simpson is a 78 y.o. female who presents to [facility] today for right knee and left ankle pain.  Patient was seen August 05, 2018 for right knee and left foot pain.  She previously has had trials of hyaluronic acid injections into the left knee with moderate benefit.  During her last visit we plan to proceed with repeat trial hyaluronic acid injection.  This was set up to start December 31 but it was delayed.  She has continued knee pain and is interested in proceeding with hyaluronic acid injections.  Additionally she notes persistent left foot pain.  She notes pain present in the lateral midfoot and lateral ankle.  Pain is worse with activity better with rest.  She has been using compression stockings which help.  She does have some diclofenac gel use previously for her knee but has not tried putting it on her foot yet.  ROS:  As above  Exam:  BP (!) 125/98   Pulse 78   Ht 5\' 4"  (1.626 m)   Wt 160 lb (72.6 kg)   BMI 27.46 kg/m  Wt Readings from Last 5 Encounters:  09/15/18 160 lb (72.6 kg)  09/02/18 159 lb (72.1 kg)  08/30/18 161 lb (73 kg)  08/05/18 162 lb (73.5 kg)  08/02/18 161 lb 9.6 oz (73.3 kg)   General: Well Developed, well nourished, and in no acute distress.  Neuro/Psych: Alert and oriented x3, extra-ocular muscles intact, able to move all 4 extremities, sensation grossly intact. Skin: Warm and dry, no rashes noted.  Respiratory: Not using accessory muscles, speaking in full sentences, trachea midline.  Cardiovascular: Pulses palpable, no extremity edema. Abdomen: Does not appear distended. MSK:  Right knee: Normal-appearing without significant effusion.  No erythema Range of motion 0-120 degrees with crepitations. Mildly tender to palpation medial and lateral joint line. Stable ligamentous exam. Intact strength.  Left foot and ankle: Pronation present into the foot and ankle with slight medial subluxation  of the ankle. Slight soft tissue swelling present at the lateral ankle and into the lateral proximal midfoot. Tender to palpation in and around the cuboid region. Normal foot and ankle motion. Pulses capillary refill and sensation are intact distally.    Lab and Radiology Results Limited musculoskeletal ultrasound left foot: Lateral ankle joint visualized with mild effusion.  Normal bony structures. The calcaneal cuboid joint is degenerative in appearance with prominent spurring at proximal cuboid.  There is a small effusion in this area.  The prominent spurring is present on the lateral and dorsal aspect of the proximal end of the cuboid and overrides the dorsal midfoot.  Entering this region and extensor tendon overrides the prominent spur.  This area is tender to palpation. No fractures visible.  .  Dg Ankle Complete Left  Result Date: 08/05/2018 CLINICAL DATA:  Lateral left ankle pain for 2 months. No known injury. EXAM: LEFT ANKLE COMPLETE - 3+ VIEW COMPARISON:  None. FINDINGS: There is no evidence of fracture, dislocation, or joint effusion. There is no evidence of arthropathy or other focal bone abnormality. Soft tissues are unremarkable. IMPRESSION: Negative exam. Electronically Signed   By: Inge Rise M.D.   On: 08/05/2018 16:09   I personally (independently) visualized and performed the interpretation of the images attached in this note.  Procedure: Real-time Ultrasound Guided Injection of right knee.  Orthovisc 1/4 Device: GE Logiq E   Images permanently stored and available for review in the ultrasound unit. Verbal informed consent  obtained.  Discussed risks and benefits of procedure. Warned about infection bleeding damage to structures skin hypopigmentation and fat atrophy among others. Patient expresses understanding and agreement Time-out conducted.   Noted no overlying erythema, induration, or other signs of local infection.   Skin prepped in a sterile fashion.     Local anesthesia: Topical Ethyl chloride.   With sterile technique and under real time ultrasound guidance:  Orthovisc injected easily.   Completed without difficulty     Advised to call if fevers/chills, erythema, induration, drainage, or persistent bleeding.   Images permanently stored and available for review in the ultrasound unit.  Impression: Technically successful ultrasound guided injection.      Assessment and Plan: 78 y.o. female with  Right knee pain and DJD.  Proceed with Orthovisc series.  Orthovisc injection 1/4 injected today.  Recheck 1 week for repeat injection.  Left dorsal foot pain.  Patient has degenerative changes in the cuboid with prominent bone spur that likely is interfering with an extensor tendon as well and is irritated and soft tissue superficial to the bone spur.  We will proceed with trial of diclofenac gel.  If not improving neck step would be consider steroid injection and also offloading this pressure area with foam padding. Recheck 1 week.    Historical information moved to improve visibility of documentation.  Past Medical History:  Diagnosis Date  . Age-related osteoporosis without current pathological fracture 08/03/2018  . Anxiety   . Breast cancer ([place])   . Generalized anxiety disorder 08/03/2018  . H/O total hysterectomy 08/03/2018  . High blood pressure   . History of breast cancer 08/03/2018  . History of primary hyperparathyroidism 07/20/2018  . Hypothyroidism   . 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 Thyroid cancer ([employer]) 07/20/2018   Past Surgical History:  Procedure Laterality Date  . APPENDECTOMY    . BROKEN BONE REPAIR    . CATARACT EXTRACTION, BILATERAL    . HYSTERECTOMY ABDOMINAL WITH SALPINGECTOMY    . NERVE REPAIR     LEFT ARM  . OOPHORECTOMY    . THYROID SURGERY    . TOTAL SHOULDER REPLACEMENT Right    Social History   Tobacco Use  . Smoking status: Never Smoker  . Smokeless tobacco: Never Used  Substance  Use Topics  . Alcohol use: Never    Frequency: Never   family history is not on file.  Medications: Current Outpatient Medications  Medication Sig Dispense Refill  . anastrozole (ARIMIDEX) 1 MG tablet Take 1 tablet (1 mg total) by mouth daily. 90 tablet 3  . carvedilol (COREG) 12.5 MG tablet Take 1 tablet (12.5 mg total) by mouth 2 (two) times daily with a meal. 180 tablet 3  . Cholecalciferol (VITAMIN D3) 50 MCG (2000 UT) TABS Take 50 mcg by mouth daily. 90 tablet 3  . docusate sodium (COLACE) 100 MG capsule Take 1 capsule (100 mg total) by mouth daily. 90 capsule 3  . levothyroxine (SYNTHROID, LEVOTHROID) 50 MCG tablet Take 1 tablet (50 mcg total) by mouth daily before breakfast. 90 tablet 3  . magnesium oxide (MAG-OX) 400 MG tablet Take by mouth.    . Melatonin 5 MG TABS Take 2.5 mg by mouth as needed. Takes 1/2 tablet, total of 2.5 mg as needed at bedtime.    . vitamin B-12 (CYANOCOBALAMIN) 1000 MCG tablet Take by mouth.     No current facility-administered medications for this visit.    Allergies  Allergen Reactions  . Lidocaine-Epinephrine Other (See Comments)  Patient states "nearly died"  . Paroxetine Palpitations  . Penicillins Other (See Comments)    Headache, blood pressure drops  . Sulfa Antibiotics Rash    Blotches  . Xylocaine  [Lidocaine Hcl]   . Risedronate     Skin growths      Discussed warning signs or symptoms. Please see discharge instructions. Patient expresses understanding.

## 2018-09-15 NOTE — Patient Instructions (Addendum)
Thank you for coming in today. Use the Voltaren gel on the foot up to 4x daily.  We will recheck foot in 1 week with next injection and can do a steroid injection at the same time.   Recheck in 1, 2, 3 weeks for orthovisc injection knee.

## 2018-09-22 ENCOUNTER — Encounter: Payer: Self-pay | Admitting: Family Medicine

## 2018-09-22 ENCOUNTER — Ambulatory Visit (INDEPENDENT_AMBULATORY_CARE_PROVIDER_SITE_OTHER): Payer: Medicare Other | Admitting: Family Medicine

## 2018-09-22 VITALS — BP 141/71 | HR 79 | Temp 97.4°F | Wt 160.0 lb

## 2018-09-22 DIAGNOSIS — M25561 Pain in right knee: Secondary | ICD-10-CM | POA: Diagnosis not present

## 2018-09-22 DIAGNOSIS — M79672 Pain in left foot: Secondary | ICD-10-CM

## 2018-09-22 DIAGNOSIS — G8929 Other chronic pain: Secondary | ICD-10-CM

## 2018-09-22 DIAGNOSIS — M1711 Unilateral primary osteoarthritis, right knee: Secondary | ICD-10-CM | POA: Diagnosis not present

## 2018-09-22 NOTE — Progress Notes (Signed)
Barbara Simpson is a 78 y.o. female who presents to [facility] today for follow-up right knee pain and left ankle pain.  [name] has been seen previously for knee pain and was started on Orthovisc series.  She had her first injection of a 4 part Orthovisc series on January 29.   Additionally she was seen for her left foot pain.  She was found to have spurring and prominence of the left cuboid.  She had trial of offloading and diclofenac gel.  She notes the diclofenac gel made her feel sick but she switch back to Aspercreme and notes that works quite well.  She is happy with how her foot is feeling.    ROS:  As above  Exam:  BP (!) 141/71   Pulse 79   Temp (!) 97.4 F (36.3 C) (Oral)   Wt 160 lb (72.6 kg)   BMI 27.46 kg/m  Wt Readings from Last 5 Encounters:  09/22/18 160 lb (72.6 kg)  09/15/18 160 lb (72.6 kg)  09/02/18 159 lb (72.1 kg)  08/30/18 161 lb (73 kg)  08/05/18 162 lb (73.5 kg)   General: Well Developed, well nourished, and in no acute distress.  Neuro/Psych: Alert and oriented x3, extra-ocular muscles intact, able to move all 4 extremities, sensation grossly intact. Skin: Warm and dry, no rashes noted.  Respiratory: Not using accessory muscles, speaking in full sentences, trachea midline.  Cardiovascular: Pulses palpable, no extremity edema. Abdomen: Does not appear distended. MSK:  Right knee: Normal-appearing with no significant effusion. Range of motion 0-100 degrees with crepitations. Nontender.  Stable ligamentous exam.  Left foot normal motion mildly tender dorsal lateral midfoot.   Procedure: Real-time Ultrasound Guided Injection of right knee Orthovisc 2/4 Device: GE Logiq E   Images permanently stored and available for review in the ultrasound unit. Verbal informed consent obtained.  Discussed risks and benefits of procedure. Warned about infection bleeding damage to structures skin hypopigmentation  and fat atrophy among others. Patient expresses understanding and agreement Time-out conducted.   Noted no overlying erythema, induration, or other signs of local infection.   Skin prepped in a sterile fashion.   Local anesthesia: Topical Ethyl chloride.   With sterile technique and under real time ultrasound guidance:  Orthovisc injected easily.   Completed without difficulty    Advised to call if fevers/chills, erythema, induration, drainage, or persistent bleeding.   Images permanently stored and available for review in the ultrasound unit.  Impression: Technically successful ultrasound guided injection.   Lot [id number]     Assessment and Plan: 78 y.o. female with  Right knee pain DJD.  Orthovisc series 2/4 today.  Recheck 1 week for Orthovisc series 3/4.  Left foot pain significant improvement with offloading, compression leave, and Aspercreme.  Continue current regimen.   Historical information moved to improve visibility of documentation.  Past Medical History:  Diagnosis Date  . Age-related osteoporosis without current pathological fracture 08/03/2018  . Anxiety   . Breast cancer ([place])   . Generalized anxiety disorder 08/03/2018  . H/O total hysterectomy 08/03/2018  . High blood pressure   . History of breast cancer 08/03/2018  . History of primary hyperparathyroidism 07/20/2018  . Hypothyroidism   . 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 Thyroid cancer ([place]) 07/20/2018   Past Surgical History:  Procedure Laterality Date  . APPENDECTOMY    . BROKEN BONE REPAIR    . CATARACT EXTRACTION, BILATERAL    .  HYSTERECTOMY ABDOMINAL WITH SALPINGECTOMY    . NERVE REPAIR     LEFT ARM  . OOPHORECTOMY    . THYROID SURGERY    . TOTAL SHOULDER REPLACEMENT Right    Social History   Tobacco Use  . Smoking status: Never Smoker  . Smokeless tobacco: Never Used  Substance Use Topics  . Alcohol use: Never    Frequency: Never   family history is not on  file.  Medications: Current Outpatient Medications  Medication Sig Dispense Refill  . carvedilol (COREG) 12.5 MG tablet Take 1 tablet (12.5 mg total) by mouth 2 (two) times daily with a meal. 180 tablet 3  . Cholecalciferol (VITAMIN D3) 50 MCG (2000 UT) TABS Take 50 mcg by mouth daily. 90 tablet 3  . docusate sodium (COLACE) 100 MG capsule Take 1 capsule (100 mg total) by mouth daily. 90 capsule 3  . levothyroxine (SYNTHROID, LEVOTHROID) 50 MCG tablet Take 1 tablet (50 mcg total) by mouth daily before breakfast. 90 tablet 3  . magnesium oxide (MAG-OX) 400 MG tablet Take by mouth.    . Melatonin 5 MG TABS Take 2.5 mg by mouth as needed. Takes 1/2 tablet, total of 2.5 mg as needed at bedtime.    . vitamin B-12 (CYANOCOBALAMIN) 1000 MCG tablet Take by mouth.     No current facility-administered medications for this visit.    Allergies  Allergen Reactions  . Lidocaine-Epinephrine Other (See Comments)    Patient states "nearly died"  . Paroxetine Palpitations  . Penicillins Other (See Comments)    Headache, blood pressure drops  . Sulfa Antibiotics Rash    Blotches  . Xylocaine  [Lidocaine Hcl]   . Risedronate     Skin growths      Discussed warning signs or symptoms. Please see discharge instructions. Patient expresses understanding.

## 2018-09-22 NOTE — Patient Instructions (Signed)
Thank you for coming in today. Call or go to the ER if you develop a large red swollen joint with extreme pain or oozing puss.   Return in 1 week for 3rd injection

## 2018-09-29 ENCOUNTER — Ambulatory Visit (INDEPENDENT_AMBULATORY_CARE_PROVIDER_SITE_OTHER): Payer: Medicare Other | Admitting: Family Medicine

## 2018-09-29 VITALS — BP 127/80 | HR 82 | Wt 161.0 lb

## 2018-09-29 DIAGNOSIS — G8929 Other chronic pain: Secondary | ICD-10-CM

## 2018-09-29 DIAGNOSIS — M25561 Pain in right knee: Secondary | ICD-10-CM

## 2018-09-29 DIAGNOSIS — M1711 Unilateral primary osteoarthritis, right knee: Secondary | ICD-10-CM | POA: Diagnosis not present

## 2018-09-29 NOTE — Progress Notes (Signed)
Barbara Simpson [birth year] [id number]  presents to clinic today for previously arranged right knee Orthovisc injection 3/4  Procedure: Real-time Ultrasound Guided Injection of right knee Orthovisc 3/4 Device: GE Logiq E   Images permanently stored and available for review in the ultrasound unit. Verbal informed consent obtained.  Discussed risks and benefits of procedure. Warned about infection bleeding damage to structures skin hypopigmentation and fat atrophy among others. Patient expresses understanding and agreement Time-out conducted.   Noted no overlying erythema, induration, or other signs of local infection.   Skin prepped in a sterile fashion.   Local anesthesia: Topical Ethyl chloride.   With sterile technique and under real time ultrasound guidance:  Orthovisc injected easily.   Completed without difficulty    Advised to call if fevers/chills, erythema, induration, drainage, or persistent bleeding.   Images permanently stored and available for review in the ultrasound unit.  Impression: Technically successful ultrasound guided injection.  Lot #[id number] expiration date January 2022.  Return in 1 week for Orthovisc injection 4/4 right knee

## 2018-09-29 NOTE — Patient Instructions (Signed)
Thank you for coming in today.   Call or go to the ER if you develop a large red swollen joint with extreme pain or oozing puss.   

## 2018-10-06 ENCOUNTER — Ambulatory Visit (INDEPENDENT_AMBULATORY_CARE_PROVIDER_SITE_OTHER): Payer: Medicare Other | Admitting: Family Medicine

## 2018-10-06 ENCOUNTER — Encounter: Payer: Self-pay | Admitting: Family Medicine

## 2018-10-06 VITALS — BP 139/65 | HR 82 | Wt 160.0 lb

## 2018-10-06 DIAGNOSIS — M1711 Unilateral primary osteoarthritis, right knee: Secondary | ICD-10-CM | POA: Diagnosis not present

## 2018-10-06 DIAGNOSIS — G8929 Other chronic pain: Secondary | ICD-10-CM

## 2018-10-06 DIAGNOSIS — M25561 Pain in right knee: Secondary | ICD-10-CM

## 2018-10-06 NOTE — Progress Notes (Signed)
  Patient presents to clinic today for her fourth in a series of 4 Orthovisc injections in her right knee.  Procedure: Real-time Ultrasound Guided Injection of right knee Orthovisc 4/4 Device: GE Logiq E   Images permanently stored and available for review in the ultrasound unit. Verbal informed consent obtained.  Discussed risks and benefits of procedure. Warned about infection bleeding damage to structures skin hypopigmentation and fat atrophy among others. Patient expresses understanding and agreement Time-out conducted.   Noted no overlying erythema, induration, or other signs of local infection.   Skin prepped in a sterile fashion.   Local anesthesia: Topical Ethyl chloride.   With sterile technique and under real time ultrasound guidance:  Orthovisc injected injected easily.   Completed without difficulty    Advised to call if fevers/chills, erythema, induration, drainage, or persistent bleeding.   Images permanently stored and available for review in the ultrasound unit.  Impression: Technically successful ultrasound guided injection.    Lot number [id number]

## 2018-10-06 NOTE — Patient Instructions (Signed)
Thank you for coming in today.   Call or go to the ER if you develop a large red swollen joint with extreme pain or oozing puss.    Recheck as needed.    

## 2018-10-19 DIAGNOSIS — L814 Other melanin hyperpigmentation: Secondary | ICD-10-CM | POA: Diagnosis not present

## 2018-10-19 DIAGNOSIS — L820 Inflamed seborrheic keratosis: Secondary | ICD-10-CM | POA: Diagnosis not present

## 2018-10-19 DIAGNOSIS — L570 Actinic keratosis: Secondary | ICD-10-CM | POA: Diagnosis not present

## 2018-12-22 ENCOUNTER — Ambulatory Visit (INDEPENDENT_AMBULATORY_CARE_PROVIDER_SITE_OTHER): Payer: Medicare Other | Admitting: Family Medicine

## 2018-12-22 ENCOUNTER — Encounter: Payer: Self-pay | Admitting: Family Medicine

## 2018-12-22 VITALS — BP 135/82 | HR 90 | Wt 158.0 lb

## 2018-12-22 DIAGNOSIS — R197 Diarrhea, unspecified: Secondary | ICD-10-CM | POA: Diagnosis not present

## 2018-12-22 DIAGNOSIS — R5383 Other fatigue: Secondary | ICD-10-CM | POA: Diagnosis not present

## 2018-12-22 DIAGNOSIS — E890 Postprocedural hypothyroidism: Secondary | ICD-10-CM

## 2018-12-22 DIAGNOSIS — Z8639 Personal history of other endocrine, nutritional and metabolic disease: Secondary | ICD-10-CM | POA: Diagnosis not present

## 2018-12-22 DIAGNOSIS — Z85850 Personal history of malignant neoplasm of thyroid: Secondary | ICD-10-CM

## 2018-12-22 DIAGNOSIS — M542 Cervicalgia: Secondary | ICD-10-CM

## 2018-12-22 DIAGNOSIS — R350 Frequency of micturition: Secondary | ICD-10-CM

## 2018-12-22 MED ORDER — DICYCLOMINE HCL 10 MG PO CAPS: 10.0000 mg | ORAL_CAPSULE | Freq: Three times a day (TID) | ORAL | 1 refills | Status: DC

## 2018-12-22 NOTE — Progress Notes (Signed)
Virtual Visit  via Video Note  I connected with      Barbara Simpson by a video enabled telemedicine application and verified that I am speaking with the correct person using two identifiers.   I discussed the limitations of evaluation and management by telemedicine and the availability of in person appointments. The patient expressed understanding and agreed to proceed.  History of Present Illness: Barbara Simpson is a 78 y.o. female who would like to discuss diarrhea for 7 weeks.   [place] notes diarrhea that occurs with some abdominal cramping.  The diarrhea occurs usually with or after eating.  She denies any abdominal pain or blood in the stool.  She does note associated with this is feeling fatigued.  She has tried using Imodium which has helped temporarily.  She denies any sick contacts or exposure to unusual food or unclean water.  She denies any camping or travel.  She denies any fevers or chills or vomiting or significant nausea.  She denies having symptoms similar to this in the past.  She does associated with her diarrhea and is increased urine output.  She denies pain or frequency urgency.  She just notes that she urinates more volume than usual.  Additionally she notes neck pain.  Over the past several months has had worsening posterior to lateral neck pain.  She notes this is worse with activity and motion.  She notes with neck motion she does experience some grinding and limited range of motion.  She denies any radiating pain weakness or numbness.   Her past medical history is significant for thyroid cancer and hyperparathyroidism status post thyroidectomy or ablation.  Additionally she has a remote history of breast cancer.    Observations/Objective: BP 135/82   Pulse 90   Wt 158 lb (71.7 kg)   BMI 27.12 kg/m  Wt Readings from Last 5 Encounters:  12/22/18 158 lb (71.7 kg)  10/06/18 160 lb (72.6 kg)  09/29/18 161 lb (73 kg)  09/22/18 160 lb (72.6 kg)  09/15/18 160  lb (72.6 kg)   Exam: Appearance nontoxic no acute distress Normal Speech.     Assessment and Plan: 78 y.o. female with  Diarrhea persisted for about 7 weeks without any obvious inciting event.  Fortunately this is not associate with bleeding or fever however duration of her symptoms are is concerning.  Plan to work-up for infectious etiology or potential other metabolic etiology with stool culture as well as CBC metabolic panel and lipase.  While we are in the process of working this up will also try treatment with dicyclomine.  On the differential is functional bowel disease such as IBS and with her cramping especially after eating she may respond well to Bentyl.  Additionally she experiences fatigue with the diarrhea.  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  Will check TSH and PTH.  Also check a urinalysis given her history of increased urine output.  Additionally she notes neck pain.  Will obtain C-spine x-ray while she is in the clinic for labs in the near future.  We will recheck in person in about 2 weeks.  This was an opportunity to follow-up the diarrhea and fatigue work-up results.  PDMP not reviewed this encounter. Orders Placed This Encounter  Procedures  . Stool Culture  . DG Cervical Spine Complete    Standing Status:   Future    Standing Expiration  Date:   02/21/2020    Order Specific Question:   Reason for Exam (SYMPTOM  OR DIAGNOSIS REQUIRED)    Answer:   eval cspine    Order Specific Question:   Preferred imaging location?    Answer:   [facility]    Order Specific Question:   Radiology Contrast Protocol - do NOT remove file path    Answer:   [email or web address]  . CBC with Differential/Platelet  . COMPLETE METABOLIC PANEL WITH GFR  . Lipase  . TSH  . PTH, Intact and Calcium  . Urinalysis, Routine w reflex  microscopic   Meds ordered this encounter  Medications  . dicyclomine (BENTYL) 10 MG capsule    Sig: Take 1 capsule (10 mg total) by mouth 4 (four) times daily -  before meals and at bedtime.    Dispense:  120 capsule    Refill:  1    Follow Up Instructions:    I discussed the assessment and treatment plan with the patient. The patient was provided an opportunity to ask questions and all were answered. The patient agreed with the plan and demonstrated an understanding of the instructions.   The patient was advised to call back or seek an in-person evaluation if the symptoms worsen or if the condition fails to improve as anticipated.  Time: 25 minutes of intraservice time, with >39 minutes of total time during today's visit.      Historical information moved to improve visibility of documentation.  Past Medical History:  Diagnosis Date  . Age-related osteoporosis without current pathological fracture 08/03/2018  . Anxiety   . Breast cancer ([place])   . Generalized anxiety disorder 08/03/2018  . H/O total hysterectomy 08/03/2018  . High blood pressure   . History of breast cancer 08/03/2018  . History of primary hyperparathyroidism 07/20/2018  . Hypothyroidism   . 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 Thyroid cancer ([place]) 07/20/2018   Past Surgical History:  Procedure Laterality Date  . APPENDECTOMY    . BROKEN BONE REPAIR    . CATARACT EXTRACTION, BILATERAL    . HYSTERECTOMY ABDOMINAL WITH SALPINGECTOMY    . NERVE REPAIR     LEFT ARM  . OOPHORECTOMY    . THYROID SURGERY    . TOTAL SHOULDER REPLACEMENT Right    Social History   Tobacco Use  . Smoking status: Never Smoker  . Smokeless tobacco: Never Used  Substance Use Topics  . Alcohol use: Never    Frequency: Never   family history is not on file.  Medications: Current Outpatient Medications  Medication Sig Dispense Refill  . carvedilol (COREG) 12.5 MG tablet Take 1 tablet (12.5 mg total) by mouth 2 (two)  times daily with a meal. 180 tablet 3  . Cholecalciferol (VITAMIN D3) 50 MCG (2000 UT) TABS Take 50 mcg by mouth daily. 90 tablet 3  . levothyroxine (SYNTHROID, LEVOTHROID) 50 MCG tablet Take 1 tablet (50 mcg total) by mouth daily before breakfast. 90 tablet 3  . magnesium oxide (MAG-OX) 400 MG tablet Take by mouth.    . Melatonin 5 MG TABS Take 2.5 mg by mouth as needed. Takes 1/2 tablet, total of 2.5 mg as needed at bedtime.    . vitamin B-12 (CYANOCOBALAMIN) 1000 MCG tablet Take by mouth.    . dicyclomine (BENTYL) 10 MG capsule Take 1 capsule (10 mg total) by mouth 4 (four) times daily -  before meals and at bedtime. 120 capsule 1  . docusate sodium (COLACE) 100 MG capsule  Take 1 capsule (100 mg total) by mouth daily. (Patient not taking: Reported on 12/22/2018) 90 capsule 3   No current facility-administered medications for this visit.    Allergies  Allergen Reactions  . Lidocaine-Epinephrine Other (See Comments)    Patient states "nearly died"  . Paroxetine Palpitations  . Penicillins Other (See Comments)    Headache, blood pressure drops  . Sulfa Antibiotics Rash    Blotches  . Xylocaine  [Lidocaine Hcl]   . Risedronate     Skin growths

## 2018-12-22 NOTE — Patient Instructions (Signed)
Thank you for coming in today. Get labs soon including blood lab, urine and stool.  I will get results back to you ASAP.   Try the dicyclomine up to 4x daily for gut spasms.   Keep me updated.  If not better soon we will want to recheck likelier in 2 weeks.    Diarrhea, Adult Diarrhea is frequent loose and watery bowel movements. Diarrhea can make you feel weak and cause you to become dehydrated. Dehydration can make you tired and thirsty, cause you to have a dry mouth, and decrease how often you urinate. Diarrhea typically lasts 2-3 days. However, it can last longer if it is a sign of something more serious. It is important to treat your diarrhea as told by your health care provider. Follow these instructions at home: Eating and drinking     Follow these recommendations as told by your health care provider:  Take an oral rehydration solution (ORS). This is an over-the-counter medicine that helps return your body to its normal balance of nutrients and water. It is found at pharmacies and retail stores.  Drink plenty of fluids, such as water, ice chips, diluted fruit juice, and low-calorie sports drinks. You can drink milk also, if desired.  Avoid drinking fluids that contain a lot of sugar or caffeine, such as energy drinks, sports drinks, and soda.  Eat bland, easy-to-digest foods in small amounts as you are able. These foods include bananas, applesauce, rice, lean meats, toast, and crackers.  Avoid alcohol.  Avoid spicy or fatty foods.  Medicines  Take over-the-counter and prescription medicines only as told by your health care provider.  If you were prescribed an antibiotic medicine, take it as told by your health care provider. Do not stop using the antibiotic even if you start to feel better. General instructions   Wash your hands often using soap and water. If soap and water are not available, use a hand sanitizer. Others in the household should wash their hands as well.  Hands should be washed: ? After using the toilet or changing a diaper. ? Before preparing, cooking, or serving food. ? While caring for a sick person or while visiting someone in a hospital.  Drink enough fluid to keep your urine pale yellow.  Rest at home while you recover.  Watch your condition for any changes.  Take a warm bath to relieve any burning or pain from frequent diarrhea episodes.  Keep all follow-up visits as told by your health care provider. This is important. Contact a health care provider if:  You have a fever.  Your diarrhea gets worse.  You have new symptoms.  You cannot keep fluids down.  You feel light-headed or dizzy.  You have a headache.  You have muscle cramps. Get help right away if:  You have chest pain.  You feel extremely weak or you faint.  You have bloody or black stools or stools that look like tar.  You have severe pain, cramping, or bloating in your abdomen.  You have trouble breathing or you are breathing very quickly.  Your heart is beating very quickly.  Your skin feels cold and clammy.  You feel confused.  You have signs of dehydration, such as: ? Dark urine, very little urine, or no urine. ? Cracked lips. ? Dry mouth. ? Sunken eyes. ? Sleepiness. ? Weakness. Summary  Diarrhea is frequent loose and watery bowel movements. Diarrhea can make you feel weak and cause you to become dehydrated.  Drink  enough fluids to keep your urine pale yellow.  Make sure that you wash your hands after using the toilet. If soap and water are not available, use hand sanitizer.  Contact a health care provider if your diarrhea gets worse or you have new symptoms.  Get help right away if you have signs of dehydration. This information is not intended to replace advice given to you by your health care provider. Make sure you discuss any questions you have with your health care provider. Document Released: 07/25/2002 Document Revised:  01/08/2018 Document Reviewed: 01/08/2018 Elsevier Interactive Patient Education  2019 [place]   Dicyclomine tablets or capsules What is this medicine? DICYCLOMINE (dye SYE kloe meen) is used to treat bowel problems including irritable bowel syndrome. This medicine may be used for other purposes; ask your health care provider or pharmacist if you have questions. COMMON BRAND NAME(S): Bentyl What should I tell my health care provider before I take this medicine? They need to know if you have any of these conditions: -difficulty passing urine -esophagus problems or heartburn -glaucoma -heart disease, or previous heart attack -myasthenia gravis -prostate trouble -stomach infection, or obstruction -ulcerative colitis -an unusual or allergic reaction to dicyclomine, other medicines, foods, dyes, or preservatives -pregnant or trying to get pregnant -breast-feeding How should I use this medicine? Take this medicine by mouth with a glass of water. Follow the directions on the prescription label. It is best to take this medicine on an empty stomach, 30 minutes to 1 hour before meals. Take your medicine at regular intervals. Do not take your medicine more often than directed. Talk to your pediatrician regarding the use of this medicine in children. Special care may be needed. While this drug may be prescribed for children as young as 52 months of age for selected conditions, precautions do apply. Patients over 78 years old may have a stronger reaction and need a smaller dose. Overdosage: If you think you have taken too much of this medicine contact a poison control center or emergency room at once. NOTE: This medicine is only for you. Do not share this medicine with others. What if I miss a dose? If you miss a dose, take it as soon as you can. If it is almost time for your next dose, take only that dose. Do not take double or extra doses. What may interact with this medicine? -amantadine  -antacids -benztropine -digoxin -disopyramide -medicines for allergies, colds and breathing difficulties -medicines for alzheimer's disease -medicines for anxiety or sleeping problems -medicines for depression or psychotic disturbances -medicines for diarrhea -medicines for pain -metoclopramide -tegaserod This list may not describe all possible interactions. Give your health care provider a list of all the medicines, herbs, non-prescription drugs, or dietary supplements you use. Also tell them if you smoke, drink alcohol, or use illegal drugs. Some items may interact with your medicine. What should I watch for while using this medicine? You may get drowsy, dizzy, or have blurred vision. Do not drive, use machinery, or do anything that needs mental alertness until you know how this medicine affects you. To reduce the risk of dizzy or fainting spells, do not sit or stand up quickly, especially if you are an older patient. Alcohol can make you more drowsy, avoid alcoholic drinks. Stay out of bright light and wear sunglasses if this medicine makes your eyes more sensitive to light. Avoid extreme heat (hot tubs, saunas). This medicine can cause you to sweat less than normal. Your body temperature could  increase to dangerous levels, which may lead to heat stroke. Antacids can stop this medicine from working. If you get an upset stomach and want to take an antacid, make sure there is an interval of at least 1 to 2 hours before or after you take this medicine. Your mouth may get dry. Chewing sugarless gum or sucking hard candy, and drinking plenty of water may help. Contact your doctor if the problem does not go away or is severe. What side effects may I notice from receiving this medicine? Side effects that you should report to your doctor or health care professional as soon as possible: -agitation, nervousness, confusion -difficulty swallowing -dizziness, drowsiness -fast or slow heartbeat  -hallucinations -pain or difficulty passing urine Side effects that usually do not require medical attention (report to your doctor or health care professional if they continue or are bothersome): -constipation -headache -nausea or vomiting -sexual difficulty This list may not describe all possible side effects. Call your doctor for medical advice about side effects. You may report side effects to FDA at 1-800-FDA-1088. Where should I keep my medicine? Keep out of the reach of children. Store at room temperature below 30 degrees C (86 degrees F). Protect from light. Throw away any unused medicine after the expiration date. NOTE: This sheet is a summary. It may not cover all possible information. If you have questions about this medicine, talk to your doctor, pharmacist, or health care provider.  2019 Elsevier/Gold Standard (2007-11-23 17:12:34)    Irritable Bowel Syndrome, Adult  Irritable bowel syndrome (IBS) is a group of symptoms that affects the organs responsible for digestion (gastrointestinal or GI tract). IBS is not one specific disease. To regulate how the GI tract works, the body sends signals back and forth between the intestines and the brain. If you have IBS, there may be a problem with these signals. As a result, the GI tract does not function normally. The intestines may become more sensitive and overreact to certain things. This may be especially true when you eat certain foods or when you are under stress. There are four types of IBS. These may be determined based on the consistency of your stool (feces):  IBS with diarrhea.  IBS with constipation.  Mixed IBS.  Unsubtyped IBS. It is important to know which type of IBS you have. Certain treatments are more likely to be helpful for certain types of IBS. What are the causes? The exact cause of IBS is not known. What increases the risk? You may have a higher risk for IBS if you:  Are female.  Are younger than 46.   Have a family history of IBS.  Have a mental health condition, such as depression, anxiety, or post-traumatic stress disorder.  Have had a bacterial infection of your GI tract. What are the signs or symptoms? Symptoms of IBS vary from person to person. The main symptom is abdominal pain or discomfort. Other symptoms usually include one or more of the following:  Diarrhea, constipation, or both.  Abdominal swelling or bloating.  Feeling full after eating a small or regular-sized meal.  Frequent gas.  Mucus in the stool.  A feeling of having more stool left after a bowel movement. Symptoms tend to come and go. They may be triggered by stress, mental health conditions, or certain foods. How is this diagnosed? This condition may be diagnosed based on a physical exam, your medical history, and your symptoms. You may have tests, such as:  Blood tests.  Stool  test.  X-rays.  CT scan.  Colonoscopy. This is a procedure in which your GI tract is viewed with a long, thin, flexible tube. How is this treated? There is no cure for IBS, but treatment can help relieve symptoms. Treatment depends on the type of IBS you have, and may include:  Changes to your diet, such as: ? Avoiding foods that cause symptoms. ? Drinking more water. ? Following a low-FODMAP (fermentable oligosaccharides, disaccharides, monosaccharides, and polyols) diet for up to 6 weeks, or as told by your health care provider. FODMAPs are sugars that are hard for some people to digest. ? Eating more fiber. ? Eating medium-sized meals at the same times every day.  Medicines. These may include: ? Fiber supplements, if you have constipation. ? Medicine to control diarrhea (antidiarrheal medicines). ? Medicine to help control muscle tightening (spasms) in your GI tract (antispasmodic medicines). ? Medicines to help with mental health conditions, such as antidepressants or tranquilizers.  Talk therapy or counseling.   Working with a diet and nutrition specialist (dietitian) to help create a food plan that is right for you.  Managing your stress. Follow these instructions at home: Eating and drinking  Eat a healthy diet.  Eat medium-sized meals at about the same time every day. Do not eat large meals.  Gradually eat more fiber-rich foods. These include whole grains, fruits, and vegetables. This may be especially helpful if you have IBS with constipation.  Eat a diet low in FODMAPs.  Drink enough fluid to keep your urine pale yellow.  Keep a journal of foods that seem to trigger symptoms.  Avoid foods and drinks that: ? Contain added sugar. ? Make your symptoms worse. Dairy products, caffeinated drinks, and carbonated drinks can make symptoms worse for some people. General instructions  Take over-the-counter and prescription medicines and supplements only as told by your health care provider.  Get enough exercise. Do at least 150 minutes of moderate-intensity exercise each week.  Manage your stress. Getting enough sleep and exercise can help you manage stress.  Keep all follow-up visits as told by your health care provider and therapist. This is important. Alcohol Use  Do not drink alcohol if: ? Your health care provider tells you not to drink. ? You are pregnant, may be pregnant, or are planning to become pregnant.  If you drink alcohol, limit how much you have: ? 0-1 drink a day for women. ? 0-2 drinks a day for men.  Be aware of how much alcohol is in your drink. In the [place] one drink equals one typical bottle of beer (12 oz), one-half glass of wine (5 oz), or one shot of hard liquor (1 oz). Contact a health care provider if you have:  Constant pain.  Weight loss.  Difficulty or pain when swallowing.  Diarrhea that gets worse. Get help right away if you have:  Severe abdominal pain.  Fever.  Diarrhea with symptoms of dehydration, such as dizziness or dry mouth.  Bright  red blood in your stool.  Stool that is black and tarry.  Abdominal swelling.  Vomiting that does not stop.  Blood in your vomit. Summary  Irritable bowel syndrome (IBS) is not one specific disease. It is a group of symptoms that affects digestion.  Your intestines may become more sensitive and overreact to certain things. This may be especially true when you eat certain foods or when you are under stress.  There is no cure for IBS, but treatment can help relieve  symptoms. This information is not intended to replace advice given to you by your health care provider. Make sure you discuss any questions you have with your health care provider. Document Released: 08/04/2005 Document Revised: 07/28/2017 Document Reviewed: 07/28/2017 Elsevier Interactive Patient Education  2019 [employer].

## 2018-12-23 ENCOUNTER — Ambulatory Visit (INDEPENDENT_AMBULATORY_CARE_PROVIDER_SITE_OTHER): Payer: Medicare Other

## 2018-12-23 ENCOUNTER — Other Ambulatory Visit: Payer: Self-pay

## 2018-12-23 DIAGNOSIS — R5383 Other fatigue: Secondary | ICD-10-CM | POA: Diagnosis not present

## 2018-12-23 DIAGNOSIS — R197 Diarrhea, unspecified: Secondary | ICD-10-CM | POA: Diagnosis not present

## 2018-12-23 DIAGNOSIS — M542 Cervicalgia: Secondary | ICD-10-CM | POA: Diagnosis not present

## 2018-12-23 DIAGNOSIS — Z8639 Personal history of other endocrine, nutritional and metabolic disease: Secondary | ICD-10-CM | POA: Diagnosis not present

## 2018-12-23 DIAGNOSIS — R350 Frequency of micturition: Secondary | ICD-10-CM | POA: Diagnosis not present

## 2018-12-23 DIAGNOSIS — E890 Postprocedural hypothyroidism: Secondary | ICD-10-CM | POA: Diagnosis not present

## 2018-12-24 DIAGNOSIS — R350 Frequency of micturition: Secondary | ICD-10-CM | POA: Diagnosis not present

## 2018-12-24 DIAGNOSIS — Z8639 Personal history of other endocrine, nutritional and metabolic disease: Secondary | ICD-10-CM | POA: Diagnosis not present

## 2018-12-24 DIAGNOSIS — E890 Postprocedural hypothyroidism: Secondary | ICD-10-CM | POA: Diagnosis not present

## 2018-12-24 DIAGNOSIS — R197 Diarrhea, unspecified: Secondary | ICD-10-CM | POA: Diagnosis not present

## 2018-12-24 DIAGNOSIS — R5383 Other fatigue: Secondary | ICD-10-CM | POA: Diagnosis not present

## 2018-12-24 LAB — COMPLETE METABOLIC PANEL WITH GFR
AG Ratio: 1.9 (calc) (ref 1.0–2.5)
ALT: 9 U/L (ref 6–29)
AST: 11 U/L (ref 10–35)
Albumin: 4.4 g/dL (ref 3.6–5.1)
Alkaline phosphatase (APISO): 81 U/L (ref 37–153)
BUN: 8 mg/dL (ref 7–25)
CO2: 29 mmol/L (ref 20–32)
Calcium: 8.9 mg/dL (ref 8.6–10.4)
Chloride: 95 mmol/L — ABNORMAL LOW (ref 98–110)
Creat: 0.6 mg/dL (ref 0.60–0.93)
GFR, Est African American: 103 mL/min/{1.73_m2} (ref >=60)
GFR, Est Non African American: 89 mL/min/{1.73_m2} (ref >=60)
Globulin: 2.3 g/dL (calc) (ref 1.9–3.7)
Glucose, Bld: 95 mg/dL (ref 65–99)
Potassium: 4.6 mmol/L (ref 3.5–5.3)
Sodium: 132 mmol/L — ABNORMAL LOW (ref 135–146)
Total Bilirubin: 0.5 mg/dL (ref 0.2–1.2)
Total Protein: 6.7 g/dL (ref 6.1–8.1)

## 2018-12-24 LAB — URINALYSIS, ROUTINE W REFLEX MICROSCOPIC
Bacteria, UA: NONE SEEN /HPF
Bilirubin Urine: NEGATIVE
Glucose, UA: NEGATIVE
Hgb urine dipstick: NEGATIVE
Hyaline Cast: NONE SEEN /LPF
Ketones, ur: NEGATIVE
Nitrite: NEGATIVE
Protein, ur: NEGATIVE
Specific Gravity, Urine: 1.006 (ref 1.001–1.03)
pH: 7 (ref 5.0–8.0)

## 2018-12-24 LAB — CBC WITH DIFFERENTIAL/PLATELET
Absolute Monocytes: 493 cells/uL (ref 200–950)
Basophils Absolute: 38 cells/uL (ref 0–200)
Basophils Relative: 0.6 %
Eosinophils Absolute: 128 cells/uL (ref 15–500)
Eosinophils Relative: 2 %
HCT: 40.9 % (ref 35.0–45.0)
Hemoglobin: 13.4 g/dL (ref 11.7–15.5)
Lymphs Abs: 1370 cells/uL (ref 850–3900)
MCH: 27.5 pg (ref 27.0–33.0)
MCHC: 32.8 g/dL (ref 32.0–36.0)
MCV: 83.8 fL (ref 80.0–100.0)
MPV: 10.6 fL (ref 7.5–12.5)
Monocytes Relative: 7.7 %
Neutro Abs: 4371 cells/uL (ref 1500–7800)
Neutrophils Relative %: 68.3 %
Platelets: 307 10*3/uL (ref 140–400)
RBC: 4.88 10*6/uL (ref 3.80–5.10)
RDW: 13.8 % (ref 11.0–15.0)
Total Lymphocyte: 21.4 %
WBC: 6.4 10*3/uL (ref 3.8–10.8)

## 2018-12-24 LAB — PTH, INTACT AND CALCIUM
Calcium: 8.9 mg/dL (ref 8.6–10.4)
PTH: 17 pg/mL (ref 14–64)

## 2018-12-24 LAB — LIPASE: Lipase: 19 U/L (ref 7–60)

## 2018-12-24 LAB — TSH: TSH: 2.32 mIU/L (ref 0.40–4.50)

## 2018-12-28 LAB — STOOL CULTURE
MICRO NUMBER:: 459350
MICRO NUMBER:: 459351
MICRO NUMBER:: 459352
SHIGA RESULT:: NOT DETECTED
SPECIMEN QUALITY:: ADEQUATE
SPECIMEN QUALITY:: ADEQUATE
SPECIMEN QUALITY:: ADEQUATE

## 2018-12-31 ENCOUNTER — Encounter: Payer: Self-pay | Admitting: Family Medicine

## 2018-12-31 ENCOUNTER — Ambulatory Visit (INDEPENDENT_AMBULATORY_CARE_PROVIDER_SITE_OTHER): Payer: Medicare Other | Admitting: Family Medicine

## 2018-12-31 VITALS — BP 134/72 | HR 79 | Temp 98.3°F | Wt 159.0 lb

## 2018-12-31 DIAGNOSIS — M542 Cervicalgia: Secondary | ICD-10-CM

## 2018-12-31 DIAGNOSIS — R197 Diarrhea, unspecified: Secondary | ICD-10-CM

## 2018-12-31 NOTE — Patient Instructions (Addendum)
Thank you for coming in today.  You should hear from PT soon.   Ok to take tylenol arthritis for pain.  Recheck as needed.   If not a lot better in 3-4 weeks let me know and next step will likely be MRI.

## 2018-12-31 NOTE — Progress Notes (Signed)
Barbara Simpson is a 78 y.o. female who presents to [facility] today for neck pain and diarrhea follow-up.  Patient has ongoing neck pain.  She notes pain is located primarily in the bilateral lower neck.   She has had some work-up already with x-rays of her cervical spine showing diffuse degenerative disc disease.  She denies any new radiating pain weakness or numbness fevers or chills.  She has pain is been present for about a month without injury.  She is tried some Tylenol and some topical over-the-counter medications which help a little.  Additionally she had a video visit on May 6 for diarrhea.  At that point diarrhea has been ongoing for several weeks.  She had work-up including stool culture which was negative as well as metabolic work-up which was unremarkable.  She was prescribed dicyclomine for possible IBS.  In the interim she notes that feeling a lot better.  She only was able to take dicyclomine once which caused significant fatigue.  She notes the diarrhea is spontaneously resolving.   ROS as above:  Exam:  BP 134/72   Pulse 79   Temp 98.3 F (36.8 C) (Oral)   Wt 159 lb (72.1 kg)   BMI 27.29 kg/m  Wt Readings from Last 5 Encounters:  12/31/18 159 lb (72.1 kg)  12/22/18 158 lb (71.7 kg)  10/06/18 160 lb (72.6 kg)  09/29/18 161 lb (73 kg)  09/22/18 160 lb (72.6 kg)    Gen: Well NAD HEENT: EOMI,  MMM Lungs: Normal work of breathing. CTABL Heart: RRR no MRG Abd: NABS, Soft. Nondistended, Nontender Exts: Brisk capillary refill, warm and well perfused.  C-spine: Nontender to midline.  Normal cervical motion with crepitation. Right upper extremity strength is intact throughout. Left upper extremity strength is intact with exception of wrist and finger extension which is diminished and chronic per patient. Reflexes are intact.  Lab and Radiology  Results Dg Cervical Spine Complete  Result Date: 12/23/2018 CLINICAL DATA:  Neck pain for several months without known injury. EXAM: CERVICAL SPINE - COMPLETE 4+ VIEW COMPARISON:  None. FINDINGS: No fracture or significant spondylolisthesis is noted. Severe degenerative disc disease is noted at C3-4, C4-5, C5-6 and C6-7. No prevertebral soft tissue swelling is noted. Mild bilateral neural foraminal stenosis is noted at C4-5, C5-6 and C6-7 secondary to uncovertebral spurring. IMPRESSION: Severe multilevel degenerative disc disease is noted. No acute abnormality seen in the cervical spine. Electronically Signed   By: Marijo Conception M.D.   On: 12/23/2018 15:58   I personally (independently) visualized and performed the interpretation of the images attached in this note.    Assessment and Plan: 78 y.o. female with  Cervical spine pain.  Significant degenerative changes on x-ray and patient has a significant cancer history.  Her pain is likely myofascial in nature.  Plan for trial of physical therapy.  If not better will proceed with MRI.  Diarrhea: Spontaneously improving.  Watchful waiting.  PDMP not reviewed this encounter. Orders Placed This Encounter  Procedures  . Ambulatory referral to Physical Therapy    Referral Priority:   Routine    Referral Type:   Physical Medicine    Referral Reason:   Specialty Services Required    Requested Specialty:   Physical Therapy   No orders of the defined types were placed in this encounter.    Historical information moved to improve visibility of documentation.  Past Medical  History:  Diagnosis Date  . Age-related osteoporosis without current pathological fracture 08/03/2018  . Anxiety   . Breast cancer ([place])   . Generalized anxiety disorder 08/03/2018  . H/O total hysterectomy 08/03/2018  . High blood pressure   . History of breast cancer 08/03/2018  . History of primary hyperparathyroidism 07/20/2018  . Hypothyroidism   . Postoperative  hypothyroidism 08/03/2018  . Thyroid cancer ([place]) 07/20/2018   Past Surgical History:  Procedure Laterality Date  . APPENDECTOMY    . BROKEN BONE REPAIR    . CATARACT EXTRACTION, BILATERAL    . HYSTERECTOMY ABDOMINAL WITH SALPINGECTOMY    . NERVE REPAIR     LEFT ARM  . OOPHORECTOMY    . THYROID SURGERY    . TOTAL SHOULDER REPLACEMENT Right    Social History   Tobacco Use  . Smoking status: Never Smoker  . Smokeless tobacco: Never Used  Substance Use Topics  . Alcohol use: Never    Frequency: Never   family history is not on file.  Medications: Current Outpatient Medications  Medication Sig Dispense Refill  . carvedilol (COREG) 12.5 MG tablet Take 1 tablet (12.5 mg total) by mouth 2 (two) times daily with a meal. 180 tablet 3  . Cholecalciferol (VITAMIN D3) 50 MCG (2000 UT) TABS Take 50 mcg by mouth daily. 90 tablet 3  . levothyroxine (SYNTHROID, LEVOTHROID) 50 MCG tablet Take 1 tablet (50 mcg total) by mouth daily before breakfast. 90 tablet 3  . magnesium oxide (MAG-OX) 400 MG tablet Take by mouth.    . Melatonin 5 MG TABS Take 2.5 mg by mouth as needed. Takes 1/2 tablet, total of 2.5 mg as needed at bedtime.    . vitamin B-12 (CYANOCOBALAMIN) 1000 MCG tablet Take by mouth.    . dicyclomine (BENTYL) 10 MG capsule Take 1 capsule (10 mg total) by mouth 4 (four) times daily -  before meals and at bedtime. (Patient not taking: Reported on 12/31/2018) 120 capsule 1  . docusate sodium (COLACE) 100 MG capsule Take 1 capsule (100 mg total) by mouth daily. (Patient not taking: Reported on 12/22/2018) 90 capsule 3   No current facility-administered medications for this visit.    Allergies  Allergen Reactions  . Lidocaine-Epinephrine Other (See Comments)    Patient states "nearly died"  . Paroxetine Palpitations  . Penicillins Other (See Comments)    Headache, blood pressure drops  . Sulfa Antibiotics Rash    Blotches  . Xylocaine  [Lidocaine Hcl]   . Risedronate     Skin growths      Discussed warning signs or symptoms. Please see discharge instructions. Patient expresses understanding.

## 2019-01-04 DIAGNOSIS — C44629 Squamous cell carcinoma of skin of left upper limb, including shoulder: Secondary | ICD-10-CM | POA: Diagnosis not present

## 2019-01-04 DIAGNOSIS — L578 Other skin changes due to chronic exposure to nonionizing radiation: Secondary | ICD-10-CM | POA: Diagnosis not present

## 2019-01-04 DIAGNOSIS — L820 Inflamed seborrheic keratosis: Secondary | ICD-10-CM | POA: Diagnosis not present

## 2019-01-04 DIAGNOSIS — L814 Other melanin hyperpigmentation: Secondary | ICD-10-CM | POA: Diagnosis not present

## 2019-01-06 ENCOUNTER — Other Ambulatory Visit: Payer: Self-pay

## 2019-01-06 ENCOUNTER — Ambulatory Visit (INDEPENDENT_AMBULATORY_CARE_PROVIDER_SITE_OTHER): Payer: Medicare Other | Admitting: Physical Therapy

## 2019-01-06 ENCOUNTER — Encounter: Payer: Self-pay | Admitting: Physical Therapy

## 2019-01-06 DIAGNOSIS — R293 Abnormal posture: Secondary | ICD-10-CM

## 2019-01-06 DIAGNOSIS — M542 Cervicalgia: Secondary | ICD-10-CM | POA: Diagnosis not present

## 2019-01-06 NOTE — Patient Instructions (Signed)

## 2019-01-07 NOTE — Therapy (Signed)
[place], [place], [address] Phone: [phone]   Fax:  [phone]  Physical Therapy Evaluation  Patient Details  Name: Barbara Simpson MRN: [id number] Date of Birth: 03-Jun-1941 Referring Provider (PT): Gregor Hams, MD   Encounter Date: 01/06/2019  PT End of Session - 01/07/19 0907    Visit Number  1    Number of Visits  12    Date for PT Re-Evaluation  02/18/19    PT Start Time  1500    PT Stop Time  1543    PT Time Calculation (min)  43 min    Activity Tolerance  Patient tolerated treatment well    Behavior During Therapy  [facility] for tasks assessed/performed       Past Medical History:  Diagnosis Date  . Age-related osteoporosis without current pathological fracture 08/03/2018  . Anxiety   . Breast cancer ([place])   . Generalized anxiety disorder 08/03/2018  . H/O total hysterectomy 08/03/2018  . High blood pressure   . History of breast cancer 08/03/2018  . History of primary hyperparathyroidism 07/20/2018  . Hypothyroidism   . 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 Thyroid cancer ([place]) 07/20/2018    Past Surgical History:  Procedure Laterality Date  . APPENDECTOMY    . BROKEN BONE REPAIR    . CATARACT EXTRACTION, BILATERAL    . HYSTERECTOMY ABDOMINAL WITH SALPINGECTOMY    . NERVE REPAIR     LEFT ARM  . OOPHORECTOMY    . THYROID SURGERY    . TOTAL SHOULDER REPLACEMENT Right     There were no vitals filed for this visit.   Subjective Assessment - 01/06/19 1459    Subjective  Pt is a 78 y/o female who presents to OPPT for chronic neck pain for approx 1 year.  Pt remembers waking up from couch and pain trying to move head/neck.      Pertinent History  multiple orthopedic injuries/surgeries    Diagnostic tests  x-rays of her cervical spine showing diffuse degenerative disc disease    Patient Stated Goals  improve pain in neck    Currently in Pain?  Yes    Pain Score  0-No  pain   up to 8/10   Pain Location  Neck    Pain Orientation  Right;Posterior    Pain Descriptors / Indicators  Spasm;Tightness   pulling   Pain Type  Chronic pain    Pain Onset  More than a month ago    Pain Frequency  Intermittent    Aggravating Factors   forward bending/looking down (housework), turning head to Rt/Lt    Pain Relieving Factors  arthritis medication         [facility] PT Assessment - 01/06/19 1504      Assessment   Medical Diagnosis  M54.2 (ICD-10-CM) - Neck pain    Referring Provider (PT)  Gregor Hams, MD    Onset Date/Surgical Date  --   1 year ago   Hand Dominance  Right    Next MD Visit  PRN    Prior Therapy  multiple times following orthopedic injuries      Precautions   Precautions  None      Restrictions   Weight Bearing Restrictions  No      Balance Screen   Has the patient fallen in the past 6 months  Yes    How many times?  1    Has the patient had a  decrease in activity level because of a fear of falling?   No    Is the patient reluctant to leave their home because of a fear of falling?   No      Home Environment   Living Environment  Private residence    Living Arrangements  Spouse/significant other    Type of [place] to enter    Entrance Stairs-Number of Steps  2    [place]  One level      Prior Function   Level of Independence  Independent    Vocation  Retired    Biomedical scientist  retired from [employer] [place]; no regular exercise      Cognition   Overall Cognitive Status  Within Functional Limits for tasks assessed      Observation/Other Assessments   Focus on Therapeutic Outcomes (FOTO)   not completed      Posture/Postural Control   Posture/Postural Control  Postural limitations    Postural Limitations  Rounded Shoulders;Forward head      ROM / Strength   AROM / PROM / Strength  AROM;Strength      AROM   Overall AROM Comments   pain with sidebending    AROM Assessment Site  Cervical    Cervical Flexion  41    Cervical Extension  36    Cervical - Right Side Bend  20    Cervical - Left Side Bend  24    Cervical - Right Rotation  30    Cervical - Left Rotation  50      Strength   Overall Strength Comments  grossly 3-4/5 bil shoulder strength; not formally tested    Strength Assessment Site  --    Right/Left Shoulder  --      Palpation   Palpation comment  tightness and trigger points noted in bil suboccipitals, cervical paraspinals into upper traps and levator scapulae      Special Tests    Special Tests  Cervical    Cervical Tests  Spurling's;Dictraction      Spurling's   Findings  Negative      Distraction Test   Findngs  Negative                Objective measurements completed on examination: See above findings.      [place] Adult PT Treatment/Exercise - 01/06/19 1504      Exercises   Exercises  Neck      Neck Exercises: Supine   Other Supine Exercise  decompression series: see pt instructions      Manual Therapy   Manual therapy comments  instructed in home suboccipital release             PT Education - 01/07/19 0906    Education Details  HEP, DN, suboccipital release    Person(s) Educated  Patient    Methods  Explanation;Demonstration;Handout    Comprehension  Verbalized understanding;Returned demonstration;Need further instruction          PT Long Term Goals - 01/07/19 0912      PT LONG TERM GOAL #1   Title  independent with HEP    Status  New    Target Date  02/18/19      PT LONG TERM GOAL #2   Title  improve cervical rotation by at least 10 degrees for improved function  Status  New    Target Date  02/18/19      PT LONG TERM GOAL #3   Title  report pain < 4/10 with activity for improved function and mobility    Status  New    Target Date  02/18/19      PT LONG TERM GOAL #4   Title  demonstrate at least 4/5 UE strength for improved function and  decreased risk of reinjury    Status  New    Target Date  02/18/19             Plan - 01/07/19 0907    Clinical Impression Statement  Pt is a 78 y/o female who presents to OPPT for neck pain.  Pt demonstrates poor postural awareness, decreased strength an ROM as well as active trigger points.  Pt will benefit from PT to maximize function and address deficits listed.    Personal Factors and Comorbidities  Age;Comorbidity 2    Comorbidities  osteoporosis, hx breast cancer    Examination-Activity Limitations  Sleep;Dressing    Examination-Participation Restrictions  Cleaning    Stability/Clinical Decision Making  Evolving/Moderate complexity    Clinical Decision Making  Moderate    Rehab Potential  Good    PT Frequency  2x / week    PT Duration  6 weeks    PT Treatment/Interventions  ADLs/Self Care Home Management;Cryotherapy;Ultrasound;Traction;Moist Heat;Electrical Stimulation;Neuromuscular re-education;Therapeutic exercise;Therapeutic activities;Patient/family education;Manual techniques;Dry needling;Passive range of motion;Taping    PT Next Visit Plan  review HEP and add postural exercises; manual/modalities/DN PRN    Consulted and Agree with Plan of Care  Patient       Patient will benefit from skilled therapeutic intervention in order to improve the following deficits and impairments:  Increased fascial restricitons, Increased muscle spasms, Postural dysfunction, Pain, Decreased range of motion, Decreased strength, Hypomobility  Visit Diagnosis: Cervicalgia - Plan: PT plan of care cert/re-cert  Abnormal posture - Plan: PT plan of care cert/re-cert     Problem List Patient Active Problem List   Diagnosis Date Noted  . Generalized anxiety disorder 08/03/2018  . 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 History of breast cancer 08/03/2018  . Age-related osteoporosis without current pathological fracture 08/03/2018  . H/O total hysterectomy 08/03/2018  . History of  primary hyperparathyroidism 07/20/2018  . History of thyroid cancer 07/20/2018      Laureen Abrahams, PT, DPT 01/07/19 9:16 AM     [facility] [place], [place], [address] Phone: [phone]   Fax:  [phone]  Name: Barbara Simpson MRN: [id number] Date of Birth: [birth year]

## 2019-01-11 ENCOUNTER — Encounter: Payer: Self-pay | Admitting: Physical Therapy

## 2019-01-11 ENCOUNTER — Ambulatory Visit (INDEPENDENT_AMBULATORY_CARE_PROVIDER_SITE_OTHER): Payer: Medicare Other | Admitting: Physical Therapy

## 2019-01-11 ENCOUNTER — Other Ambulatory Visit: Payer: Self-pay

## 2019-01-11 DIAGNOSIS — R293 Abnormal posture: Secondary | ICD-10-CM | POA: Diagnosis not present

## 2019-01-11 DIAGNOSIS — M542 Cervicalgia: Secondary | ICD-10-CM

## 2019-01-11 NOTE — Patient Instructions (Signed)
Access Code: [email or web address]  URL: [email or web address]  Date: 01/11/2019  Prepared by: Kerin Perna   Exercises  Supine Chin Tuck - 5 reps - 1 sets - 1x daily - 7x weekly  Neck Retraction - 5-10 reps - 1 sets - 3 seconds hold - 2-3x daily - 7x weekly  Seated Scapular Retraction - 5-10 reps - 1 sets - 5 seconds hold - 2-3x daily - 7x weekly  Seated Thoracic Lumbar Extension - 2-3 reps - 1 sets - 5 second hold - 1x daily - 7x weekly   Modified HEP from previous session.   - took out head press.  - leg lengthener, added same side arm lengthener.

## 2019-01-11 NOTE — Therapy (Signed)
[place], [place], [address] Phone: [phone]   Fax:  [phone]  Physical Therapy Treatment  Patient Details  Name: Barbara Simpson MRN: [id number] Date of Birth: [birth year] Referring Provider (PT): Gregor Hams, MD   Encounter Date: 01/11/2019  PT End of Session - 01/11/19 0859    Visit Number  2    Number of Visits  12    Date for PT Re-Evaluation  02/18/19    PT Start Time  0900    PT Stop Time  0950    PT Time Calculation (min)  50 min    Activity Tolerance  Patient tolerated treatment well    Behavior During Therapy  [facility] for tasks assessed/performed       Past Medical History:  Diagnosis Date  . Age-related osteoporosis without current pathological fracture 08/03/2018  . Anxiety   . Breast cancer ([place])   . Generalized anxiety disorder 08/03/2018  . H/O total hysterectomy 08/03/2018  . High blood pressure   . History of breast cancer 08/03/2018  . History of primary hyperparathyroidism 07/20/2018  . Hypothyroidism   . 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 Thyroid cancer ([place]) 07/20/2018    Past Surgical History:  Procedure Laterality Date  . APPENDECTOMY    . BROKEN BONE REPAIR    . CATARACT EXTRACTION, BILATERAL    . HYSTERECTOMY ABDOMINAL WITH SALPINGECTOMY    . NERVE REPAIR     LEFT ARM  . OOPHORECTOMY    . THYROID SURGERY    . TOTAL SHOULDER REPLACEMENT Right     There were no vitals filed for this visit.  Subjective Assessment - 01/11/19 0903    Subjective  Pt reports she was only able to perform her HEP 2x since last session, due to having spot removed from     Currently in Pain?  Yes    Pain Score  6     Pain Location  Neck    Pain Orientation  Right    Pain Descriptors / Indicators  Aching;Tightness         [facility] PT Assessment - 01/11/19 0001      Assessment   Medical Diagnosis  M54.2 (ICD-10-CM) - Neck pain    Referring Provider (PT)  Gregor Hams, MD    Onset Date/Surgical Date  --   1 year ago   Hand Dominance  Right    Next MD Visit  PRN    Prior Therapy  multiple times following orthopedic injuries       [facility] Adult PT Treatment/Exercise - 01/11/19 0001      Exercises   Exercises  Shoulder;Neck      Neck Exercises: Seated   Other Seated Exercise  thoracic ext over back of chair x 3 reps of 5 sec.       Neck Exercises: Supine   Cervical Isometrics Limitations  trial of head press - poor form despite VC/tactile cues - stopped after 4 reps    Neck Retraction  5 reps;5 secs    Neck Retraction Limitations  tactile cues initially for correct form.     Other Supine Exercise  decompression series: 5 reps of shoulder press, leg press, and leg lengthener with arm lengthener added.  Cues to not hold breath     Other Supine Exercise  trial of thoracic ext over small pool noodle - unable to tolerate, stopped immediately      Shoulder Exercises: Stretch   Other  Shoulder Stretches  supine arms at 90 deg off of table x 30 sec x 3 reps       Modalities   Modalities  Moist Heat      Moist Heat Therapy   Number Minutes Moist Heat  10 Minutes    Moist Heat Location  Shoulder;Cervical      Manual Therapy   Manual Therapy  Soft tissue mobilization;Myofascial release;Manual Traction;Passive ROM    Manual therapy comments  pt in supine     Soft tissue mobilization  STM to bilat upper trap, scalenes, levator, and cervical paraspinals    Myofascial Release  suboccipital release     Passive ROM  cervical lateral flexion, cervical rotation (some guarding noted)     Manual Traction  gentle manual traction to c-spine 10 sec holds.              PT Education - 01/11/19 0951    Education Details  HEP     Person(s) Educated  Patient    Methods  Explanation;Demonstration;Tactile cues;Verbal cues;Handout    Comprehension  Verbalized understanding;Returned demonstration          PT Long Term Goals - 01/07/19 0912      PT  LONG TERM GOAL #1   Title  independent with HEP    Status  New    Target Date  02/18/19      PT LONG TERM GOAL #2   Title  improve cervical rotation by at least 10 degrees for improved function    Status  New    Target Date  02/18/19      PT LONG TERM GOAL #3   Title  report pain < 4/10 with activity for improved function and mobility    Status  New    Target Date  02/18/19      PT LONG TERM GOAL #4   Title  demonstrate at least 4/5 UE strength for improved function and decreased risk of reinjury    Status  New    Target Date  02/18/19            Plan - 01/11/19 0957    Clinical Impression Statement  Pt required some cues for form and posture throughout session.  Some accomodations/considerations were made to exercises and transitional movements due to history of "rod in her back) and reverse Rt shoulder replacement.  She reported relief of symptoms with STM and MHP to neck at end of session.       Comorbidities  osteoporosis, hx breast cancer, thyroid cancer    Rehab Potential  Good    PT Frequency  2x / week    PT Duration  6 weeks    PT Treatment/Interventions  ADLs/Self Care Home Management;Cryotherapy;Ultrasound;Traction;Moist Heat;Electrical Stimulation;Neuromuscular re-education;Therapeutic exercise;Therapeutic activities;Patient/family education;Manual techniques;Dry needling;Passive range of motion;Taping    PT Next Visit Plan  add L's and/or W's to HEP; manual therapy as needed; progress HEP as tolerated.     Consulted and Agree with Plan of Care  Patient       Patient will benefit from skilled therapeutic intervention in order to improve the following deficits and impairments:  Increased fascial restricitons, Increased muscle spasms, Postural dysfunction, Pain, Decreased range of motion, Decreased strength, Hypomobility  Visit Diagnosis: Cervicalgia  Abnormal posture     Problem List Patient Active Problem List   Diagnosis Date Noted  . Generalized  anxiety disorder 08/03/2018  . 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 History of breast cancer 08/03/2018  . Age-related  osteoporosis without current pathological fracture 08/03/2018  . H/O total hysterectomy 08/03/2018  . History of primary hyperparathyroidism 07/20/2018  . History of thyroid cancer 07/20/2018   Kerin Perna, PTA 01/11/19 11:42 AM  [place], [place], [address] Phone: [phone]   Fax:  [phone]  Name: Barbara Simpson MRN: [id number] Date of Birth: [birth year]

## 2019-01-13 DIAGNOSIS — D0462 Carcinoma in situ of skin of left upper limb, including shoulder: Secondary | ICD-10-CM | POA: Diagnosis not present

## 2019-01-14 ENCOUNTER — Encounter: Payer: Medicare Other | Admitting: Physical Therapy

## 2019-01-18 ENCOUNTER — Encounter: Payer: Medicare Other | Admitting: Physical Therapy

## 2019-01-20 ENCOUNTER — Encounter: Payer: Medicare Other | Admitting: Physical Therapy

## 2019-01-25 ENCOUNTER — Encounter: Payer: Medicare Other | Admitting: Physical Therapy

## 2019-01-27 ENCOUNTER — Encounter: Payer: Medicare Other | Admitting: Physical Therapy

## 2019-02-01 ENCOUNTER — Ambulatory Visit (INDEPENDENT_AMBULATORY_CARE_PROVIDER_SITE_OTHER): Payer: Medicare Other | Admitting: Physical Therapy

## 2019-02-01 ENCOUNTER — Encounter: Payer: Self-pay | Admitting: Physical Therapy

## 2019-02-01 ENCOUNTER — Other Ambulatory Visit: Payer: Self-pay

## 2019-02-01 DIAGNOSIS — R293 Abnormal posture: Secondary | ICD-10-CM

## 2019-02-01 DIAGNOSIS — M542 Cervicalgia: Secondary | ICD-10-CM

## 2019-02-01 NOTE — Patient Instructions (Signed)
Access Code: [email or web address]  URL: [email or web address]  Date: 02/01/2019  Prepared by: Kerin Perna   Exercises  Issued yellow band for these exercises: Standing Bilateral Low Shoulder Row with Anchored Resistance - 10 reps - 1 sets - 1x daily - 7x weekly  Shoulder Extension with Resistance - 10 reps - 1 sets - 1x daily - 7x weekly

## 2019-02-01 NOTE — Therapy (Signed)
[place], [place], [address] Phone: [phone]   Fax:  [phone]  Physical Therapy Treatment  Patient Details  Name: Barbara Simpson MRN: [id number] Date of Birth: March 10, 1941 Referring Provider (PT): Gregor Hams, MD   Encounter Date: 02/01/2019  PT End of Session - 02/01/19 1101    Visit Number  3    Number of Visits  12    Date for PT Re-Evaluation  02/18/19    PT Start Time  69    PT Stop Time  1156   MHP last 10 min   PT Time Calculation (min)  54 min    Activity Tolerance  Patient tolerated treatment well    Behavior During Therapy  [facility] for tasks assessed/performed       Past Medical History:  Diagnosis Date  . Age-related osteoporosis without current pathological fracture 08/03/2018  . Anxiety   . Breast cancer ([place])   . Generalized anxiety disorder 08/03/2018  . H/O total hysterectomy 08/03/2018  . High blood pressure   . History of breast cancer 08/03/2018  . History of primary hyperparathyroidism 07/20/2018  . Hypothyroidism   . 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 Thyroid cancer ([place]) 07/20/2018    Past Surgical History:  Procedure Laterality Date  . APPENDECTOMY    . BROKEN BONE REPAIR    . CATARACT EXTRACTION, BILATERAL    . HYSTERECTOMY ABDOMINAL WITH SALPINGECTOMY    . NERVE REPAIR     LEFT ARM  . OOPHORECTOMY    . THYROID SURGERY    . TOTAL SHOULDER REPLACEMENT Right     There were no vitals filed for this visit.  Subjective Assessment - 02/01/19 1101    Subjective  Pt had spot on arm removed that turned out to be cancerous; has had follow up surgery.  She has not done many of the exercises, but when she does, her neck does feel a little better.    Currently in Pain?  Yes    Pain Score  4     Pain Location  Neck    Pain Orientation  Right;Left    Pain Descriptors / Indicators  Aching    Aggravating Factors   lifting things, sleep positions    Pain  Relieving Factors  massage         [facility] PT Assessment - 02/01/19 0001      Assessment   Medical Diagnosis  M54.2 (ICD-10-CM) - Neck pain    Referring Provider (PT)  Gregor Hams, MD    Onset Date/Surgical Date  --   1 year ago   Hand Dominance  Right    Next MD Visit  PRN    Prior Therapy  multiple times following orthopedic injuries      AROM   Cervical Flexion  54    Cervical Extension  45    Cervical - Right Side Bend  34    Cervical - Left Side Bend  36    Cervical - Right Rotation  40    Cervical - Left Rotation  45       [facility] Adult PT Treatment/Exercise - 02/01/19 0001      Neck Exercises: Seated   Cervical Rotation  Right;Left;5 reps    Lateral Flexion  Right;Left;5 reps    Shoulder Rolls  5 reps;Backwards      Shoulder Exercises: Supine   Other Supine Exercises  thoracic lift/ scap retraction x 5 sec x 10 reps  Shoulder Exercises: Standing   Extension  Strengthening;Both;Theraband;5 reps    Theraband Level (Shoulder Extension)  Level 1 (Yellow)    Row  Strengthening;Both;10 reps;Theraband    Theraband Level (Shoulder Row)  Level 1 (Yellow)    Other Standing Exercises  L's and W's x 3 sec x 10 (limited ROM in bilat shoulders) noodle behind back       Shoulder Exercises: Stretch   Other Shoulder Stretches  thoracic ext over back of chair x 3 reps of 5 sec (head supported- cues for improved form)      Moist Heat Therapy   Number Minutes Moist Heat  10 Minutes    Moist Heat Location  Cervical      Manual Therapy   Manual Therapy  Soft tissue mobilization;Myofascial release;Manual Traction    Manual therapy comments  pt in supine     Soft tissue mobilization  STM to bilat upper trap, scalenes, levator, and cervical paraspinals    Myofascial Release  suboccipital release     Manual Traction  gentle manual traction to c-spine 10 sec holds.       Neck Exercises: Stretches   Upper Trapezius Stretch  Right;Left;2 reps;10 seconds             PT  Education - 02/01/19 1155    Education Details  HEP    Person(s) Educated  Patient    Methods  Explanation;Handout;Demonstration;Verbal cues    Comprehension  Verbalized understanding;Returned demonstration          PT Long Term Goals - 01/07/19 0912      PT LONG TERM GOAL #1   Title  independent with HEP    Status  New    Target Date  02/18/19      PT LONG TERM GOAL #2   Title  improve cervical rotation by at least 10 degrees for improved function    Status  New    Target Date  02/18/19      PT LONG TERM GOAL #3   Title  report pain < 4/10 with activity for improved function and mobility    Status  New    Target Date  02/18/19      PT LONG TERM GOAL #4   Title  demonstrate at least 4/5 UE strength for improved function and decreased risk of reinjury    Status  New    Target Date  02/18/19            Plan - 02/01/19 1101    Clinical Impression Statement  Pt has limited shoulder ROM, however she was able to tolerate all new exercises with small modifications.  Neck ROM has improved since last visit; partially met LTG#2.  Pt making gradual progress towards goals.    Comorbidities  osteoporosis, hx breast cancer, thyroid cancer    Rehab Potential  Good    PT Frequency  2x / week    PT Duration  6 weeks    PT Treatment/Interventions  ADLs/Self Care Home Management;Cryotherapy;Ultrasound;Traction;Moist Heat;Electrical Stimulation;Neuromuscular re-education;Therapeutic exercise;Therapeutic activities;Patient/family education;Manual techniques;Dry needling;Passive range of motion;Taping    PT Next Visit Plan  manual therapy as needed; progress HEP as tolerated.    Consulted and Agree with Plan of Care  Patient       Patient will benefit from skilled therapeutic intervention in order to improve the following deficits and impairments:  Increased fascial restricitons, Increased muscle spasms, Postural dysfunction, Pain, Decreased range of motion, Decreased strength,  Hypomobility  Visit Diagnosis: 1.  Cervicalgia   2. Abnormal posture        Problem List Patient Active Problem List   Diagnosis Date Noted  . Generalized anxiety disorder 08/03/2018  . 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 History of breast cancer 08/03/2018  . Age-related osteoporosis without current pathological fracture 08/03/2018  . H/O total hysterectomy 08/03/2018  . History of primary hyperparathyroidism 07/20/2018  . History of thyroid cancer 07/20/2018   Kerin Perna, PTA 02/01/19 12:04 PM  [place], [place], [address] Phone: [phone]   Fax:  [phone]  Name: Barbara Simpson MRN: [id number] Date of Birth: Oct 06, 1940

## 2019-02-03 ENCOUNTER — Ambulatory Visit (INDEPENDENT_AMBULATORY_CARE_PROVIDER_SITE_OTHER): Payer: Medicare Other | Admitting: Physical Therapy

## 2019-02-03 ENCOUNTER — Other Ambulatory Visit: Payer: Self-pay

## 2019-02-03 ENCOUNTER — Encounter: Payer: Self-pay | Admitting: Physical Therapy

## 2019-02-03 DIAGNOSIS — M542 Cervicalgia: Secondary | ICD-10-CM

## 2019-02-03 DIAGNOSIS — R293 Abnormal posture: Secondary | ICD-10-CM

## 2019-02-03 NOTE — Therapy (Signed)
[place], [place], [address] Phone: [phone]   Fax:  [phone]  Physical Therapy Treatment  Patient Details  Name: Barbara Simpson MRN: [id number] Date of Birth: [birth year] Referring Provider (PT): Gregor Hams, MD   Encounter Date: 02/03/2019  PT End of Session - 02/03/19 1212    Visit Number  4    Number of Visits  12    Date for PT Re-Evaluation  03/04/19   extended due to missed weeks   PT Start Time  1137   pt arrived late   PT Stop Time  1220    PT Time Calculation (min)  43 min    Activity Tolerance  Patient tolerated treatment well    Behavior During Therapy  [facility] for tasks assessed/performed       Past Medical History:  Diagnosis Date  . Age-related osteoporosis without current pathological fracture 08/03/2018  . Anxiety   . Breast cancer ([place])   . Generalized anxiety disorder 08/03/2018  . H/O total hysterectomy 08/03/2018  . High blood pressure   . History of breast cancer 08/03/2018  . History of primary hyperparathyroidism 07/20/2018  . Hypothyroidism   . 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 Thyroid cancer ([place]) 07/20/2018    Past Surgical History:  Procedure Laterality Date  . APPENDECTOMY    . BROKEN BONE REPAIR    . CATARACT EXTRACTION, BILATERAL    . HYSTERECTOMY ABDOMINAL WITH SALPINGECTOMY    . NERVE REPAIR     LEFT ARM  . OOPHORECTOMY    . THYROID SURGERY    . TOTAL SHOULDER REPLACEMENT Right     There were no vitals filed for this visit.  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    Diagnostic tests  x-rays of her cervical spine showing diffuse degenerative disc disease    Patient Stated Goals  improve pain in neck    Currently in Pain?  Yes    Pain Score  3     Pain Location  Neck    Pain Orientation  Right;Left    Pain Descriptors  / Indicators  Aching    Pain Type  Chronic pain    Pain Onset  More than a month ago    Pain Frequency  Intermittent    Aggravating Factors   lifting things, sleep positions    Pain Relieving Factors  massage                       [facility] Adult PT Treatment/Exercise - 02/03/19 1145      Neck Exercises: Machines for Strengthening   UBE (Upper Arm Bike)  L1 x 4 min (2' each direction)      Neck Exercises: Theraband   Shoulder Extension  10 reps;Red    Shoulder Extension Limitations  5 sec hold    Rows  10 reps;Red    Rows Limitations  5 sec hold    Other Theraband Exercises  overhead pull x 10 reps; red therabanc      Neck Exercises: Seated   Cervical Rotation  Both;10 reps    Lateral Flexion  Both;10 reps    Shoulder Rolls  Backwards;10 reps    Other Seated Exercise  seated scapular retraction 10 x 5 sec hold      Moist Heat Therapy  Number Minutes Moist Heat  10 Minutes    Moist Heat Location  Cervical      Manual Therapy   Manual Therapy  Soft tissue mobilization;Manual Traction;Myofascial release    Manual therapy comments  pt in supine     Soft tissue mobilization  STM to bilat upper trap, scalenes, levator, and cervical paraspinals    Myofascial Release  suboccipital release     Manual Traction  gentle manual traction to c-spine 10 sec holds.              PT Education - 02/03/19 1211    Education Details  DN    Person(s) Educated  Patient    Methods  Explanation;Demonstration;Handout    Comprehension  Verbalized understanding          PT Long Term Goals - 01/07/19 0912      PT LONG TERM GOAL #1   Title  independent with HEP    Status  New    Target Date  02/18/19      PT LONG TERM GOAL #2   Title  improve cervical rotation by at least 10 degrees for improved function    Status  New    Target Date  02/18/19      PT LONG TERM GOAL #3   Title  report pain < 4/10 with activity for improved function and mobility    Status  New     Target Date  02/18/19      PT LONG TERM GOAL #4   Title  demonstrate at least 4/5 UE strength for improved function and decreased risk of reinjury    Status  New    Target Date  02/18/19            Plan - 02/03/19 1213    Clinical Impression Statement  Pt reports improvement in pain overall, and able to tolerate HEP.  Feel she may benefit from DN, so education provided.  Will consider for next visits.    Comorbidities  osteoporosis, hx breast cancer, thyroid cancer    Rehab Potential  Good    PT Frequency  2x / week    PT Duration  6 weeks    PT Treatment/Interventions  ADLs/Self Care Home Management;Cryotherapy;Ultrasound;Traction;Moist Heat;Electrical Stimulation;Neuromuscular re-education;Therapeutic exercise;Therapeutic activities;Patient/family education;Manual techniques;Dry needling;Passive range of motion;Taping    PT Next Visit Plan  manual therapy as needed; progress HEP as tolerated., DN if pt agrees    PT Home Exercise Plan  Access Code: [email or web address]    Consulted and Agree with Plan of Care  Patient       Patient will benefit from skilled therapeutic intervention in order to improve the following deficits and impairments:  Increased fascial restricitons, Increased muscle spasms, Postural dysfunction, Pain, Decreased range of motion, Decreased strength, Hypomobility  Visit Diagnosis: 1. Cervicalgia   2. Abnormal posture        Problem List Patient Active Problem List   Diagnosis Date Noted  . Generalized anxiety disorder 08/03/2018  . 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 History of breast cancer 08/03/2018  . Age-related osteoporosis without current pathological fracture 08/03/2018  . H/O total hysterectomy 08/03/2018  . History of primary hyperparathyroidism 07/20/2018  . History of thyroid cancer 07/20/2018      Laureen Abrahams, PT, DPT 02/03/19 12:14 PM     [facility] [place], [place], [address] Phone: [phone]   Fax:  [phone]  Name: Barbara Simpson MRN: [id number]  Date of Birth: Dec 23, 1940

## 2019-02-03 NOTE — Patient Instructions (Signed)
Access Code: [email or web address]  URL: [email or web address]  Date: 02/03/2019  Prepared by: Faustino Congress   Exercises  Supine Chin Tuck - 5 reps - 1 sets - 1x daily - 7x weekly  Neck Retraction - 5-10 reps - 1 sets - 3 seconds hold - 2-3x daily - 7x weekly  Seated Scapular Retraction - 5-10 reps - 1 sets - 5 seconds hold - 2-3x daily - 7x weekly  Seated Thoracic Lumbar Extension - 2-3 reps - 1 sets - 5 second hold - 1x daily - 7x weekly  Standing Bilateral Low Shoulder Row with Anchored Resistance - 10 reps - 1 sets - 1x daily - 7x weekly  Shoulder Extension with Resistance - 10 reps - 1 sets - 1x daily - 7x weekly  Patient Education  Trigger Point Dry Needling

## 2019-02-09 ENCOUNTER — Other Ambulatory Visit: Payer: Self-pay

## 2019-02-09 ENCOUNTER — Ambulatory Visit (INDEPENDENT_AMBULATORY_CARE_PROVIDER_SITE_OTHER): Payer: Medicare Other | Admitting: Physical Therapy

## 2019-02-09 ENCOUNTER — Encounter: Payer: Self-pay | Admitting: Physical Therapy

## 2019-02-09 DIAGNOSIS — R293 Abnormal posture: Secondary | ICD-10-CM

## 2019-02-09 DIAGNOSIS — M542 Cervicalgia: Secondary | ICD-10-CM

## 2019-02-09 NOTE — Therapy (Signed)
[place], [place], [address] Phone: [phone]   Fax:  [phone]  Physical Therapy Treatment  Patient Details  Name: Barbara Simpson MRN: [id number] Date of Birth: 03-Jul-1941 Referring Provider (PT): Gregor Hams, MD   Encounter Date: 02/09/2019  PT End of Session - 02/09/19 1109    Visit Number  5    Number of Visits  12    Date for PT Re-Evaluation  03/04/19   extended due to missed weeks   PT Start Time  1027    PT Stop Time  1115    PT Time Calculation (min)  48 min    Activity Tolerance  Patient tolerated treatment well    Behavior During Therapy  [facility] for tasks assessed/performed       Past Medical History:  Diagnosis Date  . Age-related osteoporosis without current pathological fracture 08/03/2018  . Anxiety   . Breast cancer ([place])   . Generalized anxiety disorder 08/03/2018  . H/O total hysterectomy 08/03/2018  . High blood pressure   . History of breast cancer 08/03/2018  . History of primary hyperparathyroidism 07/20/2018  . Hypothyroidism   . 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 Thyroid cancer ([place]) 07/20/2018    Past Surgical History:  Procedure Laterality Date  . APPENDECTOMY    . BROKEN BONE REPAIR    . CATARACT EXTRACTION, BILATERAL    . HYSTERECTOMY ABDOMINAL WITH SALPINGECTOMY    . NERVE REPAIR     LEFT ARM  . OOPHORECTOMY    . THYROID SURGERY    . TOTAL SHOULDER REPLACEMENT Right     There were no vitals filed for this visit.  Subjective Assessment - 02/09/19 1024    Subjective  doing well, reports neck is a little better    Pertinent History  multiple orthopedic injuries/surgeries    Patient Stated Goals  improve pain in neck    Currently in Pain?  Yes    Pain Score  7    "it's better" but previously reported 3/10   Pain Location  Neck    Pain Orientation  Right;Left    Pain Descriptors / Indicators  Aching    Pain Type  Chronic pain    Pain  Onset  More than a month ago    Pain Frequency  Intermittent    Aggravating Factors   lifting thing, sleep positions    Pain Relieving Factors  massage                       [facility] Adult PT Treatment/Exercise - 02/09/19 1029      Neck Exercises: Machines for Strengthening   UBE (Upper Arm Bike)  L2 x 4 min (2' each direction)      Neck Exercises: Theraband   Shoulder Extension  10 reps   yellow   Shoulder Extension Limitations  5 sec hold; difficulty with red due to shouler    Rows  10 reps;Red    Rows Limitations  5 sec hold      Neck Exercises: Standing   Other Standing Exercises  scapular retraction in doorway 10x5 sec      Neck Exercises: Seated   Shoulder Rolls  Backwards;10 reps      Neck Exercises: Supine   Neck Retraction  10 reps;5 secs    Cervical Rotation  Right;Left;10 reps      Moist Heat Therapy   Number Minutes Moist Heat  10 Minutes  Moist Heat Location  Cervical      Manual Therapy   Manual Therapy  Soft tissue mobilization;Manual Traction;Myofascial release    Manual therapy comments  pt in supine     Soft tissue mobilization  STM to bilat upper trap, scalenes, levator, and cervical paraspinals    Myofascial Release  suboccipital release     Manual Traction  gentle manual traction to c-spine 10 sec holds.       Neck Exercises: Stretches   Upper Trapezius Stretch  Right;Left;3 reps;30 seconds    Other Neck Stretches  low doorway stretch 3x30 sec    Other Neck Stretches  seated forward flexion mid/Lt/Rt with green physioball 3x10 sec each direction                  PT Long Term Goals - 01/07/19 0912      PT LONG TERM GOAL #1   Title  independent with HEP    Status  New    Target Date  02/18/19      PT LONG TERM GOAL #2   Title  improve cervical rotation by at least 10 degrees for improved function    Status  New    Target Date  02/18/19      PT LONG TERM GOAL #3   Title  report pain < 4/10 with activity for  improved function and mobility    Status  New    Target Date  02/18/19      PT LONG TERM GOAL #4   Title  demonstrate at least 4/5 UE strength for improved function and decreased risk of reinjury    Status  New    Target Date  02/18/19            Plan - 02/09/19 1109    Clinical Impression Statement  Pt tolerated session well and reports that pain is improved, but rated higher than previous visits.  Declined DN at this time.    Comorbidities  osteoporosis, hx breast cancer, thyroid cancer    Rehab Potential  Good    PT Frequency  2x / week    PT Duration  6 weeks    PT Treatment/Interventions  ADLs/Self Care Home Management;Cryotherapy;Ultrasound;Traction;Moist Heat;Electrical Stimulation;Neuromuscular re-education;Therapeutic exercise;Therapeutic activities;Patient/family education;Manual techniques;Dry needling;Passive range of motion;Taping    PT Next Visit Plan  manual therapy as needed; progress HEP as tolerated., DN if pt agrees    PT Home Exercise Plan  Access Code: [email or web address]    Consulted and Agree with Plan of Care  Patient       Patient will benefit from skilled therapeutic intervention in order to improve the following deficits and impairments:  Increased fascial restricitons, Increased muscle spasms, Postural dysfunction, Pain, Decreased range of motion, Decreased strength, Hypomobility  Visit Diagnosis: 1. Cervicalgia   2. Abnormal posture        Problem List Patient Active Problem List   Diagnosis Date Noted  . Generalized anxiety disorder 08/03/2018  . 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 History of breast cancer 08/03/2018  . Age-related osteoporosis without current pathological fracture 08/03/2018  . H/O total hysterectomy 08/03/2018  . History of primary hyperparathyroidism 07/20/2018  . History of thyroid cancer 07/20/2018      Laureen Abrahams, PT, DPT 02/09/19 11:11 AM     [facility] [place], [place], [address] Phone: [phone]   Fax:  [phone]  Name: Barbara Simpson MRN: [id number] Date of Birth: 1941/03/19

## 2019-02-10 ENCOUNTER — Telehealth: Payer: Self-pay | Admitting: Osteopathic Medicine

## 2019-02-10 NOTE — Telephone Encounter (Signed)
Patient calling in stating that she has bone spur on top of her left ankle and states that per last visit Dr.Corey states that to have it removed she would need to see Dr.Thekkekandam. Would like to know what to do so she does not have to come twice. Please advise.

## 2019-02-11 ENCOUNTER — Other Ambulatory Visit: Payer: Self-pay

## 2019-02-11 ENCOUNTER — Encounter: Payer: Medicare Other | Admitting: Physical Therapy

## 2019-02-11 ENCOUNTER — Ambulatory Visit (INDEPENDENT_AMBULATORY_CARE_PROVIDER_SITE_OTHER): Payer: Medicare Other | Admitting: Physical Therapy

## 2019-02-11 DIAGNOSIS — M542 Cervicalgia: Secondary | ICD-10-CM

## 2019-02-11 DIAGNOSIS — R293 Abnormal posture: Secondary | ICD-10-CM | POA: Diagnosis not present

## 2019-02-11 NOTE — Telephone Encounter (Signed)
Dr. Dianah Field cannot remove a bone spur in clinic.  Let us see each other and discuss exactly what can and cannot be done in clinic.  Often if that is something that can be done I can do order Dr. Dianah Field can do it same day.

## 2019-02-11 NOTE — Therapy (Addendum)
[place], [place], [address] Phone: [phone]   Fax:  [phone]  Physical Therapy Treatment/Discharge  Patient Details  Name: BANA BORGMEYER MRN: [id number] Date of Birth: September 03, 1940 Referring Provider (PT): Gregor Hams, MD   Encounter Date: 02/11/2019  PT End of Session - 02/11/19 1413    Visit Number  6    Number of Visits  12    Date for PT Re-Evaluation  03/04/19    PT Start Time  1414    PT Stop Time  1502   MHP last 10 min   PT Time Calculation (min)  48 min    Activity Tolerance  Patient tolerated treatment well    Behavior During Therapy  [facility] for tasks assessed/performed       Past Medical History:  Diagnosis Date  . Age-related osteoporosis without current pathological fracture 08/03/2018  . Anxiety   . Breast cancer ([place])   . Generalized anxiety disorder 08/03/2018  . H/O total hysterectomy 08/03/2018  . High blood pressure   . History of breast cancer 08/03/2018  . History of primary hyperparathyroidism 07/20/2018  . Hypothyroidism   . 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 Thyroid cancer ([place]) 07/20/2018    Past Surgical History:  Procedure Laterality Date  . APPENDECTOMY    . BROKEN BONE REPAIR    . CATARACT EXTRACTION, BILATERAL    . HYSTERECTOMY ABDOMINAL WITH SALPINGECTOMY    . NERVE REPAIR     LEFT ARM  . OOPHORECTOMY    . THYROID SURGERY    . TOTAL SHOULDER REPLACEMENT Right     There were no vitals filed for this visit.  Subjective Assessment - 02/11/19 1452    Subjective  "I got worked too hard the last session".  Pt requests this to be her last session.  She hasn't had much time to do her exercises lately.    Pertinent History  multiple orthopedic injuries/surgeries    Diagnostic tests  x-rays of her cervical spine showing diffuse degenerative disc disease    Patient Stated Goals  improve pain in neck    Currently in Pain?  Yes    Pain Score  8      Pain Location  Neck    Pain Orientation  Right;Left    Pain Descriptors / Indicators  Aching;Sharp    Aggravating Factors   looking down, lifting things    Pain Relieving Factors  massage         [facility] PT Assessment - 02/11/19 0001      Assessment   Medical Diagnosis  M54.2 (ICD-10-CM) - Neck pain    Referring Provider (PT)  Gregor Hams, MD    Onset Date/Surgical Date  --   1 year ago   Hand Dominance  Right    Next MD Visit  PRN    Prior Therapy  multiple times following orthopedic injuries      AROM   Cervical - Right Side Bend  32    Cervical - Left Side Bend  34    Cervical - Right Rotation  55    Cervical - Left Rotation  50      Strength   Overall Strength Comments  grossly 3-4/5 bil shoulder strength; not formally tested      [facility] Adult PT Treatment/Exercise - 02/11/19 0001      Neck Exercises: Standing   Other Standing Exercises  standing scap retraction with W's x 3 sec x  10 reps (back against pool noodle)       Neck Exercises: Seated   Cervical Rotation  Both;10 reps    Lateral Flexion  Both;5 reps    Shoulder Rolls  Backwards;10 reps      Shoulder Exercises: Standing   Extension  Strengthening;Both;Theraband;10 reps    Row  Strengthening;Both;10 reps;Theraband    Theraband Level (Shoulder Row)  Level 1 (Yellow)      Modalities   Modalities  Moist Heat      Moist Heat Therapy   Number Minutes Moist Heat  10 Minutes    Moist Heat Location  Cervical      Manual Therapy   Manual Therapy  Soft tissue mobilization;Myofascial release;Manual Traction    Manual therapy comments  pt in supine     Soft tissue mobilization  STM to bilat upper trap, scalenes, levator, and cervical paraspinals    Myofascial Release  suboccipital release     Manual Traction  gentle manual traction to c-spine 10 sec holds.                   PT Long Term Goals - 02/11/19 1451      PT LONG TERM GOAL #1   Title  independent with HEP    Status  partially met      PT LONG TERM GOAL #2   Title  improve cervical rotation by at least 10 degrees for improved function    Status  partially met      PT LONG TERM GOAL #3   Title  report pain < 4/10 with activity for improved function and mobility    Status  Not Met      PT LONG TERM GOAL #4   Status  Not Met            Plan - 02/11/19 1456    Clinical Impression Statement  Pt had difficulty tolerating standing exericses due to elevated pain level today.  Good reduction in palpable tightness with STM to neck in supine.  Pt has not met her goals, but requests to d/c at this time.    Comorbidities  osteoporosis, hx breast cancer, thyroid cancer    Rehab Potential  Good    PT Frequency  2x / week    PT Duration  6 weeks    PT Treatment/Interventions  ADLs/Self Care Home Management;Cryotherapy;Ultrasound;Traction;Moist Heat;Electrical Stimulation;Neuromuscular re-education;Therapeutic exercise;Therapeutic activities;Patient/family education;Manual techniques;Dry needling;Passive range of motion;Taping    PT Next Visit Plan  spoke to supervising PT; will d/c per pt request.       Patient will benefit from skilled therapeutic intervention in order to improve the following deficits and impairments:  Increased fascial restricitons, Increased muscle spasms, Postural dysfunction, Pain, Decreased range of motion, Decreased strength, Hypomobility  Visit Diagnosis: 1. Cervicalgia   2. Abnormal posture        Problem List Patient Active Problem List   Diagnosis Date Noted  . Generalized anxiety disorder 08/03/2018  . 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 History of breast cancer 08/03/2018  . Age-related osteoporosis without current pathological fracture 08/03/2018  . H/O total hysterectomy 08/03/2018  . History of primary hyperparathyroidism 07/20/2018  . History of thyroid cancer 07/20/2018   Kerin Perna, PTA 02/11/19 3:32 PM  [place], [place], [address] Phone: [phone]   Fax:  [phone]  Name: JETTIE MANNOR MRN: [id number] Date of Birth: 1941-06-26       PHYSICAL  THERAPY DISCHARGE SUMMARY  Visits from Start of Care: 6  Current functional level related to goals / functional outcomes: See above   Remaining deficits: See above - pt requested d/c    Education / Equipment: HEP  Plan: Patient agrees to discharge.  Patient goals were not met. Patient is being discharged due to the patient's request.  ?????    Laureen Abrahams, PT, DPT 02/14/19 2:23 PM  [facility] [place], [place] [address]  [phone] (office) [phone] (fax)

## 2019-02-11 NOTE — Telephone Encounter (Signed)
Called patient and notified to make appointment. Transferred her to schedule appointment. KG LPN

## 2019-02-14 ENCOUNTER — Encounter: Payer: Self-pay | Admitting: Family Medicine

## 2019-02-14 ENCOUNTER — Ambulatory Visit (INDEPENDENT_AMBULATORY_CARE_PROVIDER_SITE_OTHER): Payer: Medicare Other | Admitting: Family Medicine

## 2019-02-14 VITALS — BP 133/72 | HR 79 | Temp 97.8°F | Wt 159.0 lb

## 2019-02-14 DIAGNOSIS — M7752 Other enthesopathy of left foot: Secondary | ICD-10-CM

## 2019-02-14 DIAGNOSIS — M79672 Pain in left foot: Secondary | ICD-10-CM | POA: Diagnosis not present

## 2019-02-14 NOTE — Progress Notes (Signed)
Barbara Simpson is a 78 y.o. female who presents to [facility] today for left ankle pain.  Patient has been seen several times for left foot pain thought to be related to cuboid calcaneal degeneration changes seen on ultrasound in January 2020.  In the interim she notes her foot pain is continued.  She is tried Aspercreme which helps a little.  She notes continued pain with ambulation.  She notes sometimes she feels a pop in the lateral foot and ankle.  She also had been seen for neck pain.  She did physical therapy which helped some.  She is also using Tylenol arthritis which helps quite a bit.   ROS:  As above  Exam:  BP 133/72   Pulse 79   Temp 97.8 F (36.6 C) (Oral)   Wt 159 lb (72.1 kg)   BMI 27.29 kg/m  Wt Readings from Last 5 Encounters:  02/14/19 159 lb (72.1 kg)  12/31/18 159 lb (72.1 kg)  12/22/18 158 lb (71.7 kg)  10/06/18 160 lb (72.6 kg)  09/29/18 161 lb (73 kg)   General: Well Developed, well nourished, and in no acute distress.  Neuro/Psych: Alert and oriented x3, extra-ocular muscles intact, able to move all 4 extremities, sensation grossly intact. Skin: Warm and dry, no rashes noted.  Respiratory: Not using accessory muscles, speaking in full sentences, trachea midline.  Cardiovascular: Pulses palpable, no extremity edema. Abdomen: Does not appear distended. MSK:  Left foot and ankle largely normal.  No deformity. Normal motion.  Pulses cap refill sensation intact distally.  Tender palpation at cuboid.    Lab and Radiology Results Procedure: Real-time Ultrasound Guided Injection of left cuboid bone spur injection Device: GE Logiq E   Images permanently stored and available for review in the ultrasound unit. Verbal informed consent obtained.  Discussed risks and benefits of procedure. Warned about infection bleeding damage to structures skin hypopigmentation and fat atrophy among others. Patient expresses  understanding and agreement Time-out conducted.   Noted no overlying erythema, induration, or other signs of local infection.    Present examination reveals a prominent bone spur cuboid with surrounding hypoechoic fluid consistent with edema.  Skin prepped in a sterile fashion.   Local anesthesia: Topical Ethyl chloride.   With sterile technique and under real time ultrasound guidance:  40 mg of Kenalog and 1 mL Marcaine injected easily around cuboid bone spur and into the cuboid calcaneus and cuboid metatarsal joint Completed without difficulty   Pain immediately resolved suggesting accurate placement of the medication.   Advised to call if fevers/chills, erythema, induration, drainage, or persistent bleeding.   Images permanently stored and available for review in the ultrasound unit.  Impression: Technically successful ultrasound guided injection.       EXAM: LEFT ANKLE COMPLETE - 3+ VIEW  COMPARISON:  None.  FINDINGS: There is no evidence of fracture, dislocation, or joint effusion. There is no evidence of arthropathy or other focal bone abnormality. Soft tissues are unremarkable.  IMPRESSION: Negative exam.   Electronically Signed   By: Inge Rise M.D.   On: 08/05/2018 16:09  I personally (independently) visualized and performed the interpretation of the images attached in this note.   Assessment and Plan: 78 y.o. female with left foot cuboid prominent bone spur.  Status post injection.  Medication was injected in a pepper pattern around the cuboid spur.  Recheck as needed.  Advance activity as tolerated.  If not improving will return for reevaluation.  PDMP not reviewed this encounter. No orders of the defined types were placed in this encounter.  No orders of the defined types were placed in this encounter.   Historical information moved to improve visibility of documentation.  Past Medical History:  Diagnosis Date  . Age-related osteoporosis  without current pathological fracture 08/03/2018  . Anxiety   . Breast cancer ([place])   . Generalized anxiety disorder 08/03/2018  . H/O total hysterectomy 08/03/2018  . High blood pressure   . History of breast cancer 08/03/2018  . History of primary hyperparathyroidism 07/20/2018  . Hypothyroidism   . 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 Thyroid cancer ([place]) 07/20/2018   Past Surgical History:  Procedure Laterality Date  . APPENDECTOMY    . BROKEN BONE REPAIR    . CATARACT EXTRACTION, BILATERAL    . HYSTERECTOMY ABDOMINAL WITH SALPINGECTOMY    . NERVE REPAIR     LEFT ARM  . OOPHORECTOMY    . THYROID SURGERY    . TOTAL SHOULDER REPLACEMENT Right    Social History   Tobacco Use  . Smoking status: Never Smoker  . Smokeless tobacco: Never Used  Substance Use Topics  . Alcohol use: Never    Frequency: Never   family history is not on file.  Medications: Current Outpatient Medications  Medication Sig Dispense Refill  . carvedilol (COREG) 12.5 MG tablet Take 1 tablet (12.5 mg total) by mouth 2 (two) times daily with a meal. 180 tablet 3  . Cholecalciferol (VITAMIN D3) 50 MCG (2000 UT) TABS Take 50 mcg by mouth daily. 90 tablet 3  . dicyclomine (BENTYL) 10 MG capsule Take 1 capsule (10 mg total) by mouth 4 (four) times daily -  before meals and at bedtime. (Patient not taking: Reported on 01/06/2019) 120 capsule 1  . docusate sodium (COLACE) 100 MG capsule Take 1 capsule (100 mg total) by mouth daily. (Patient not taking: Reported on 01/06/2019) 90 capsule 3  . levothyroxine (SYNTHROID, LEVOTHROID) 50 MCG tablet Take 1 tablet (50 mcg total) by mouth daily before breakfast. 90 tablet 3  . magnesium oxide (MAG-OX) 400 MG tablet Take by mouth.    . Melatonin 5 MG TABS Take 2.5 mg by mouth as needed. Takes 1/2 tablet, total of 2.5 mg as needed at bedtime.    . vitamin B-12 (CYANOCOBALAMIN) 1000 MCG tablet Take by mouth.     No current facility-administered medications for this  visit.    Allergies  Allergen Reactions  . Lidocaine-Epinephrine Other (See Comments)    Patient states "nearly died"  . Paroxetine Palpitations  . Penicillins Other (See Comments)    Headache, blood pressure drops  . Sulfa Antibiotics Rash    Blotches  . Xylocaine  [Lidocaine Hcl]   . Risedronate     Skin growths      Discussed warning signs or symptoms. Please see discharge instructions. Patient expresses understanding.

## 2019-02-14 NOTE — Patient Instructions (Signed)
Thank you for coming in today. Call or go to the ER if you develop a large red swollen joint with extreme pain or oozing puss.  Let me know how you do about the injection. Advance activity as tolerated.    Ostectomy of the Foot Ostectomy of the foot is a surgery to remove part of a bone in the foot. You may need this procedure if:  You have an abnormal bone growth called a bone spur (osteophyte) in your foot.  Bones in your foot are not lined up with each other (misalignment). The procedure may be done if one of these conditions is causing pain or limiting movement. It is usually done when other treatments have not helped. During an ostectomy, part of the bone is removed to shorten or reshape the bone. Tell a health care provider about:  Any allergies you have.  All medicines you are taking, including vitamins, herbs, eye drops, creams, and over-the-counter medicines.  Any problems you or family members have had with anesthetic medicines.  Any blood disorders you have.  Any surgeries you have had.  Any medical conditions you have.  Whether you are pregnant or may be pregnant. What are the risks? Generally, this is a safe procedure. However, problems may occur, including:  Infection.  Bleeding.  Pain.  Stiffness.  Numbness.  Allergic reactions to medicines.  Damage to nerves or blood vessels in the foot.  A blood clot that forms in the leg and travels to the lung.  Failure of the bone to heal.  The abnormal bone growth coming back again (recurrence). What happens before the procedure? Staying hydrated Follow instructions from your health care provider about hydration, which may include:  Up to 2 hours before the procedure - you may continue to drink clear liquids, such as water, clear fruit juice, black coffee, and plain tea. Eating and drinking restrictions Follow instructions from your health care provider about eating and drinking, which may include:  8  hours before the procedure - stop eating heavy meals or foods such as meat, fried foods, or fatty foods.  6 hours before the procedure - stop eating light meals or foods, such as toast or cereal.  6 hours before the procedure - stop drinking milk or drinks that contain milk.  2 hours before the procedure - stop drinking clear liquids. Medicines  Ask your health care provider about: ? Changing or stopping your regular medicines. This is especially important if you are taking diabetes medicines or blood thinners. ? Taking medicines such as aspirin and ibuprofen. These medicines can thin your blood. Do not take these medicines before your procedure if your health care provider instructs you not to.  You may be given antibiotic medicine to help prevent infection. General instructions  You may have foot X-rays done while you are standing on your foot. This allows the surgeon to clearly see the abnormal bone growth in your foot.  Ask your health care provider how your surgical site will be marked or identified.  You may be asked to shower with a germ-killing soap.  Plan to have a responsible adult care for you for at least 24 hours after you leave the hospital or clinic. This is important. What happens during the procedure?  To lower your risk of infection: ? Your health care team will wash or sanitize their hands. ? Your skin will be washed with soap. ? Hair may be removed from the surgical area.  You will be given one  or more of the following: ? A medicine to help you relax (sedative). ? A medicine to numb the area (local anesthetic). ? A medicine to make you fall asleep (general anesthetic). ? A medicine that is injected into your spine to numb the area below and slightly above the injection site (spinal anesthetic). ? A medicine that is injected into an area of your body to numb everything below the injection site (regional anesthetic).  An incision will be made in your foot.   Tissues, nerves, and blood vessels near the bone will be moved out of the way.  The spur or the part of bone that needs to be removed will be cut away using a bone saw or a bone shaving instrument.  The remaining bone may be secured with screws, wires, or plates.  The skin incision will be closed with stitches (sutures) or another type of skin closure.  A bandage (dressing) will be placed over the incision.  A soft wrap may be placed around your foot. The procedure may vary among health care providers and hospitals. What happens after the procedure?  Your blood pressure, heart rate, breathing rate, and blood oxygen level will be monitored until the medicines you were given have worn off.  Your foot may be placed in a protective shoe, a splint, or a cast.  You may be given crutches or a walker to help you walk without putting weight (bearing weight) on your foot.  Do not drive for 24 hours if you were given a sedative. Summary  Ostectomy of the foot is a surgery to remove part of a bone in the foot.  You may need this procedure if you have an abnormal bone growth called a bone spur (osteophyte) or if bones in your foot are not lined up with each other (misalignment). The procedure may be done if you have pain or limited movement and other treatments have not helped.  Follow instructions from your health care provider about eating and drinking before the procedure.  After the procedure, your foot may be placed in a protective shoe, a splint, or a cast. You may be given crutches or a walker to help you walk without putting weight (bearing weight) on your foot. This information is not intended to replace advice given to you by your health care provider. Make sure you discuss any questions you have with your health care provider. Document Released: 09/23/2016 Document Revised: 08/07/2017 Document Reviewed: 09/23/2016 Elsevier Patient Education  2020 [employer].

## 2019-02-15 ENCOUNTER — Encounter: Payer: Medicare Other | Admitting: Physical Therapy

## 2019-02-17 ENCOUNTER — Encounter: Payer: Self-pay | Admitting: Physical Therapy

## 2019-02-17 ENCOUNTER — Encounter: Payer: Medicare Other | Admitting: Physical Therapy

## 2019-02-17 DIAGNOSIS — M810 Age-related osteoporosis without current pathological fracture: Secondary | ICD-10-CM | POA: Diagnosis not present

## 2019-02-17 DIAGNOSIS — Z8639 Personal history of other endocrine, nutritional and metabolic disease: Secondary | ICD-10-CM | POA: Diagnosis not present

## 2019-02-22 DIAGNOSIS — C73 Malignant neoplasm of thyroid gland: Secondary | ICD-10-CM | POA: Diagnosis not present

## 2019-03-03 ENCOUNTER — Other Ambulatory Visit: Payer: Self-pay

## 2019-03-03 ENCOUNTER — Inpatient Hospital Stay: Payer: Medicare Other

## 2019-03-03 ENCOUNTER — Inpatient Hospital Stay: Payer: Medicare Other | Attending: Hematology & Oncology | Admitting: Hematology & Oncology

## 2019-03-03 ENCOUNTER — Encounter: Payer: Self-pay | Admitting: Hematology & Oncology

## 2019-03-03 VITALS — BP 134/81 | HR 73 | Temp 98.2°F | Resp 16 | Wt 158.0 lb

## 2019-03-03 DIAGNOSIS — Z9223 Personal history of estrogen therapy: Secondary | ICD-10-CM | POA: Diagnosis not present

## 2019-03-03 DIAGNOSIS — Z853 Personal history of malignant neoplasm of breast: Secondary | ICD-10-CM | POA: Insufficient documentation

## 2019-03-03 DIAGNOSIS — Z170 Estrogen receptor positive status [ER+]: Secondary | ICD-10-CM | POA: Diagnosis not present

## 2019-03-03 DIAGNOSIS — Z9013 Acquired absence of bilateral breasts and nipples: Secondary | ICD-10-CM | POA: Diagnosis not present

## 2019-03-03 LAB — CMP (CANCER CENTER ONLY)
ALT: 7 U/L (ref 0–44)
AST: 10 U/L — ABNORMAL LOW (ref 15–41)
Albumin: 4.1 g/dL (ref 3.5–5.0)
Alkaline Phosphatase: 75 U/L (ref 38–126)
Anion gap: 7 (ref 5–15)
BUN: 9 mg/dL (ref 8–23)
CO2: 30 mmol/L (ref 22–32)
Calcium: 8.2 mg/dL — ABNORMAL LOW (ref 8.9–10.3)
Chloride: 93 mmol/L — ABNORMAL LOW (ref 98–111)
Creatinine: 0.69 mg/dL (ref 0.44–1.00)
GFR, Est AFR Am: >60 mL/min (ref >60)
GFR, Estimated: >60 mL/min (ref >60)
Glucose, Bld: 97 mg/dL (ref 70–99)
Potassium: 4.7 mmol/L (ref 3.5–5.1)
Sodium: 130 mmol/L — ABNORMAL LOW (ref 135–145)
Total Bilirubin: 0.5 mg/dL (ref 0.3–1.2)
Total Protein: 6.2 g/dL — ABNORMAL LOW (ref 6.5–8.1)

## 2019-03-03 LAB — CBC WITH DIFFERENTIAL (CANCER CENTER ONLY)
Abs Immature Granulocytes: 0.03 10*3/uL (ref 0.00–0.07)
Basophils Absolute: 0 10*3/uL (ref 0.0–0.1)
Basophils Relative: 0 %
Eosinophils Absolute: 0.2 10*3/uL (ref 0.0–0.5)
Eosinophils Relative: 2 %
HCT: 38.6 % (ref 36.0–46.0)
Hemoglobin: 12.6 g/dL (ref 12.0–15.0)
Immature Granulocytes: 0 %
Lymphocytes Relative: 18 %
Lymphs Abs: 1.6 10*3/uL (ref 0.7–4.0)
MCH: 27.9 pg (ref 26.0–34.0)
MCHC: 32.6 g/dL (ref 30.0–36.0)
MCV: 85.4 fL (ref 80.0–100.0)
Monocytes Absolute: 0.8 10*3/uL (ref 0.1–1.0)
Monocytes Relative: 8 %
Neutro Abs: 6.3 10*3/uL (ref 1.7–7.7)
Neutrophils Relative %: 72 %
Platelet Count: 275 10*3/uL (ref 150–400)
RBC: 4.52 MIL/uL (ref 3.87–5.11)
RDW: 14.7 % (ref 11.5–15.5)
WBC Count: 8.9 10*3/uL (ref 4.0–10.5)
nRBC: 0 % (ref 0.0–0.2)

## 2019-03-03 NOTE — Progress Notes (Signed)
Hematology and Oncology Follow Up Visit  Barbara Simpson [id number] [birth year] 78 y.o. 03/03/2019   Principle Diagnosis:   Early stage breast cancer of the left breast-stage not known.  Status post bilateral mastectomies in 2014  Current Therapy:    Status post Arimidex x5 years.  Completed this in January 2020     Interim History:  Barbara Simpson is back for second office visit.  We saw her back in January 2020.  She been doing pretty well.  She was had no specific complaints.  She is basically staying home because of the coronavirus  She has had no problems with the coronavirus.  She is trying to walk.  She is watching what she eats.  She has had no problems with the implants that she has.  There are saline implants.  She has had no change in bowel or bladder habits.  She has had no joint issues.  There is been no headache.  Apparently she does have some thyroid issues that is being followed by endocrinology.  Overall, her performance status is Barbara Simpson 1.    Medications:  Current Outpatient Medications:  .  carvedilol (COREG) 12.5 MG tablet, Take 1 tablet (12.5 mg total) by mouth 2 (two) times daily with a meal., Disp: 180 tablet, Rfl: 3 .  Cholecalciferol (VITAMIN D3) 50 MCG (2000 UT) TABS, Take 50 mcg by mouth daily., Disp: 90 tablet, Rfl: 3 .  dicyclomine (BENTYL) 10 MG capsule, Take 1 capsule (10 mg total) by mouth 4 (four) times daily -  before meals and at bedtime. (Patient not taking: Reported on 01/06/2019), Disp: 120 capsule, Rfl: 1 .  docusate sodium (COLACE) 100 MG capsule, Take 1 capsule (100 mg total) by mouth daily. (Patient not taking: Reported on 01/06/2019), Disp: 90 capsule, Rfl: 3 .  levothyroxine (SYNTHROID, LEVOTHROID) 50 MCG tablet, Take 1 tablet (50 mcg total) by mouth daily before breakfast., Disp: 90 tablet, Rfl: 3 .  magnesium oxide (MAG-OX) 400 MG tablet, Take by mouth., Disp: , Rfl:  .  Melatonin 5 MG TABS, Take 2.5 mg by mouth as needed. Takes 1/2 tablet,  total of 2.5 mg as needed at bedtime., Disp: , Rfl:  .  vitamin B-12 (CYANOCOBALAMIN) 1000 MCG tablet, Take by mouth., Disp: , Rfl:   Allergies:  Allergies  Allergen Reactions  . Lidocaine-Epinephrine Other (See Comments)    Patient states "nearly died"  . Paroxetine Palpitations  . Penicillins Other (See Comments)    Headache, blood pressure drops  . Sulfa Antibiotics Rash    Blotches  . Xylocaine  [Lidocaine Hcl]   . Risedronate     Skin growths    Past Medical History, Surgical history, Social history, and Family History were reviewed and updated.  Review of Systems: Review of Systems  Constitutional: Negative.   HENT:  Negative.   Eyes: Negative.   Respiratory: Negative.   Cardiovascular: Negative.   Gastrointestinal: Negative.   Endocrine: Negative.   Genitourinary: Negative.    Musculoskeletal: Negative.   Skin: Negative.   Neurological: Negative.   Hematological: Negative.   Psychiatric/Behavioral: Negative.     Physical Exam:  weight is 158 lb (71.7 kg). Her oral temperature is 98.2 F (36.8 C). Her blood pressure is 134/81 and her pulse is 73. Her respiration is 16 and oxygen saturation is 100%.   Wt Readings from Last 3 Encounters:  03/03/19 158 lb (71.7 kg)  02/14/19 159 lb (72.1 kg)  12/31/18 159 lb (72.1 kg)    Physical  Exam Vitals signs reviewed.  Constitutional:      Comments: Breast exam shows bilateral mastectomies.  She has implants bilaterally.  They appear to be intact.  There is no erythema or nodularity associated with the implants.  There is no bilateral axillary adenopathy.  HENT:     Head: Normocephalic and atraumatic.  Eyes:     Pupils: Pupils are equal, round, and reactive to light.  Neck:     Musculoskeletal: Normal range of motion.  Cardiovascular:     Rate and Rhythm: Normal rate and regular rhythm.     Heart sounds: Normal heart sounds.  Pulmonary:     Effort: Pulmonary effort is normal.     Breath sounds: Normal breath  sounds.  Abdominal:     General: Bowel sounds are normal.     Palpations: Abdomen is soft.  Musculoskeletal: Normal range of motion.        General: No tenderness or deformity.  Lymphadenopathy:     Cervical: No cervical adenopathy.  Skin:    General: Skin is warm and dry.     Findings: No erythema or rash.  Neurological:     Mental Status: She is alert and oriented to person, place, and time.  Psychiatric:        Behavior: Behavior normal.        Thought Content: Thought content normal.        Judgment: Judgment normal.      Lab Results  Component Value Date   WBC 8.9 03/03/2019   HGB 12.6 03/03/2019   HCT 38.6 03/03/2019   MCV 85.4 03/03/2019   PLT 275 03/03/2019     Chemistry      Component Value Date/Time   NA 130 (L) 03/03/2019 1126   K 4.7 03/03/2019 1126   CL 93 (L) 03/03/2019 1126   CO2 30 03/03/2019 1126   BUN 9 03/03/2019 1126   CREATININE 0.69 03/03/2019 1126   CREATININE 0.60 12/23/2018 1114      Component Value Date/Time   CALCIUM 8.2 (L) 03/03/2019 1126   ALKPHOS 75 03/03/2019 1126   AST 10 (L) 03/03/2019 1126   ALT 7 03/03/2019 1126   BILITOT 0.5 03/03/2019 1126       Impression and Plan: Barbara Simpson is a 78 year old white female.  She had breast cancer of the left breast back in 2014.  Again, I do not have any information about this.  It sounds like this was probably stage I since she did not need any hemotherapy or radiation therapy.  She was placed on Arimidex.  We got her off the Arimidex when we saw her in January of this year.  She was on for 5 years.  I do not see any problems with respect to recurrent breast cancer.  Hopefully, this will not be a problem for her.  At this point, we will plan to get her back in 6 more months.     Volanda Napoleon, MD 7/16/20205:13 PM

## 2019-03-04 LAB — CANCER ANTIGEN 27.29: CA 27.29: 29.2 U/mL (ref 0.0–38.6)

## 2019-04-14 DIAGNOSIS — L820 Inflamed seborrheic keratosis: Secondary | ICD-10-CM | POA: Diagnosis not present

## 2019-05-04 ENCOUNTER — Other Ambulatory Visit: Payer: Self-pay

## 2019-05-04 ENCOUNTER — Ambulatory Visit (INDEPENDENT_AMBULATORY_CARE_PROVIDER_SITE_OTHER): Payer: Medicare Other | Admitting: Osteopathic Medicine

## 2019-05-04 DIAGNOSIS — Z23 Encounter for immunization: Secondary | ICD-10-CM

## 2019-05-17 ENCOUNTER — Other Ambulatory Visit: Payer: Self-pay

## 2019-05-17 MED ORDER — CARVEDILOL 12.5 MG PO TABS: 12.5000 mg | ORAL_TABLET | Freq: Two times a day (BID) | ORAL | 0 refills | Status: DC

## 2019-06-02 ENCOUNTER — Ambulatory Visit (INDEPENDENT_AMBULATORY_CARE_PROVIDER_SITE_OTHER): Payer: Medicare Other

## 2019-06-02 ENCOUNTER — Ambulatory Visit (INDEPENDENT_AMBULATORY_CARE_PROVIDER_SITE_OTHER): Payer: Medicare Other | Admitting: Family Medicine

## 2019-06-02 ENCOUNTER — Other Ambulatory Visit: Payer: Self-pay

## 2019-06-02 VITALS — BP 146/79 | HR 90 | Temp 98.1°F | Wt 161.0 lb

## 2019-06-02 DIAGNOSIS — G8929 Other chronic pain: Secondary | ICD-10-CM

## 2019-06-02 DIAGNOSIS — M545 Low back pain: Secondary | ICD-10-CM | POA: Diagnosis not present

## 2019-06-02 DIAGNOSIS — M542 Cervicalgia: Secondary | ICD-10-CM | POA: Diagnosis not present

## 2019-06-02 MED ORDER — DULOXETINE HCL 20 MG PO CPEP: 20.0000 mg | ORAL_CAPSULE | Freq: Every day | ORAL | 1 refills | Status: DC

## 2019-06-02 NOTE — Progress Notes (Signed)
Barbara Simpson is a 78 y.o. female who presents to [facility] today for pain  Patient was seen in May for neck pain thought to be related to DDD.  She had a trial of physical therapy including 6 sessions culminated in June.  I saw her back in late June she had had some symptom improvement.  In the interval she notes worsening neck pain.  She notes grinding and pain in her bilateral neck worse than the right.  She denies any radiating pain down her arm.  She denies weakness or numbness.  She is tried over-the-counter medications heating pad and as above a course of physical therapy with little benefit.  Pain is moderate and occurs daily and is very obnoxious.  Additionally she notes pain in her right lower back.  This is been ongoing for a few months as well.  She denies any radiating pain weakness or numbness.  Pain is worse with activity better with rest.  Again tried medications and treatments as above.   ROS:  As above  Exam:  BP (!) 146/79   Pulse 90   Temp 98.1 F (36.7 C) (Oral)   Wt 161 lb (73 kg)   BMI 27.64 kg/m  Wt Readings from Last 5 Encounters:  06/02/19 161 lb (73 kg)  03/03/19 158 lb (71.7 kg)  02/14/19 159 lb (72.1 kg)  12/31/18 159 lb (72.1 kg)  12/22/18 158 lb (71.7 kg)   General: Well Developed, well nourished, and in no acute distress.  Neuro/Psych: Alert and oriented x3, extra-ocular muscles intact, able to move all 4 extremities, sensation grossly intact. Skin: Warm and dry, no rashes noted.  Respiratory: Not using accessory muscles, speaking in full sentences, trachea midline.  Cardiovascular: Pulses palpable, no extremity edema. Abdomen: Does not appear distended. MSK:  C-spine: Nontender to spinal midline decreased cervical motion. Cervical paraspinal musculature mildly tender. Bilateral upper extremity strength reflexes sensation equal normal throughout.  L-spine: Nontender to midline.  Tender palpation  right lumbar paraspinal musculature. Normal lumbar motion.  Lower extremity strength reflexes and sensation are equal and normal throughout.    Lab and Radiology Results No results found for this or any previous visit (from the past 72 hour(s)). Dg Lumbar Spine Complete  Result Date: 06/02/2019 CLINICAL DATA:  Chronic low back pain EXAM: LUMBAR SPINE - COMPLETE 4+ VIEW COMPARISON:  None. FINDINGS: Mild multilevel degenerative disc disease. Facet arthrosis noted at L4-5 and L5-S1. No pars interarticularis defect. Alignment is normal. No compression fracture. IMPRESSION: No acute abnormality of the lumbar spine. Mild multilevel degenerative disc disease and lower lumbar facet arthrosis. Electronically Signed   By: Deatra Robinson M.D.   On: 06/02/2019 17:46   EXAM: CERVICAL SPINE - COMPLETE 4+ VIEW  COMPARISON:  None.  FINDINGS: No fracture or significant spondylolisthesis is noted. Severe degenerative disc disease is noted at C3-4, C4-5, C5-6 and C6-7. No prevertebral soft tissue swelling is noted. Mild bilateral neural foraminal stenosis is noted at C4-5, C5-6 and C6-7 secondary to uncovertebral spurring.  IMPRESSION: Severe multilevel degenerative disc disease is noted. No acute abnormality seen in the cervical spine.   Electronically Signed   By: Lupita Raider M.D.   On: 12/23/2018 15:58   I personally (independently) visualized and performed the interpretation of the images attached in this note.     Assessment and Plan: 78 y.o. female with neck and low back pain.  C-spine pain: Chronic degenerative likely.  Barbara Simpson has had  a reasonable trial of conservative management including physical therapy and oral nonopiate pain medications.  This has not helped much.  At this point plan to proceed with MRI for potential facet injection planning.  Additionally will start some alternative medications for pain control.  We will try Cymbalta with plan to titrate dose as needed.   Other options would include Lyrica or amitriptyline.  After MRI is done we will discuss plan.  Additionally patient has L-spine pain.  Degenerative as above.  Consider physical therapy for this issue.  Will reassess near future.   PDMP not reviewed this encounter. Orders Placed This Encounter  Procedures  . MR Cervical Spine Wo Contrast    Standing Status:   Future    Standing Expiration Date:   08/01/2020    Order Specific Question:   What is the patient's sedation requirement?    Answer:   No Sedation    Order Specific Question:   Does the patient have a pacemaker or implanted devices?    Answer:   No    Order Specific Question:   Preferred imaging location?    Answer:   [facility] (table limit-350lbs)    Order Specific Question:   Radiology Contrast Protocol - do NOT remove file path    Answer:   \\[email or web address]  . DG Lumbar Spine Complete    Standing Status:   Future    Number of Occurrences:   1    Standing Expiration Date:   08/01/2020    Order Specific Question:   Reason for Exam (SYMPTOM  OR DIAGNOSIS REQUIRED)    Answer:   chronic right low back pain    Order Specific Question:   Preferred imaging location?    Answer:   [facility]    Order Specific Question:   Radiology Contrast Protocol - do NOT remove file path    Answer:   \\[email or web address]   Meds ordered this encounter  Medications  . DULoxetine (CYMBALTA) 20 MG capsule    Sig: Take 1 capsule (20 mg total) by mouth daily.    Dispense:  30 capsule    Refill:  1    Historical information moved to improve visibility of documentation.  Past Medical History:  Diagnosis Date  . Age-related osteoporosis without current pathological fracture 08/03/2018  . Anxiety   . Breast cancer (HCC)   . Generalized anxiety disorder 08/03/2018  . H/O total hysterectomy 08/03/2018  . High blood pressure   . History of breast cancer  08/03/2018  . History of primary hyperparathyroidism 07/20/2018  . Hypothyroidism   . 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 Thyroid cancer (HCC) 07/20/2018   Past Surgical History:  Procedure Laterality Date  . APPENDECTOMY    . BROKEN BONE REPAIR    . CATARACT EXTRACTION, BILATERAL    . HYSTERECTOMY ABDOMINAL WITH SALPINGECTOMY    . NERVE REPAIR     LEFT ARM  . OOPHORECTOMY    . THYROID SURGERY    . TOTAL SHOULDER REPLACEMENT Right    Social History   Tobacco Use  . Smoking status: Never Smoker  . Smokeless tobacco: Never Used  Substance Use Topics  . Alcohol use: Never    Frequency: Never   family history is not on file.  Medications: Current Outpatient Medications  Medication Sig Dispense Refill  . carvedilol (COREG) 12.5 MG tablet Take 1 tablet (12.5 mg total) by mouth 2 (two) times daily with a meal. 180 tablet 0  . Cholecalciferol (VITAMIN D3) 50 MCG (  2000 UT) TABS Take 50 mcg by mouth daily. 90 tablet 3  . levothyroxine (SYNTHROID, LEVOTHROID) 50 MCG tablet Take 1 tablet (50 mcg total) by mouth daily before breakfast. 90 tablet 3  . magnesium oxide (MAG-OX) 400 MG tablet Take by mouth.    . Melatonin 5 MG TABS Take 2.5 mg by mouth as needed. Takes 1/2 tablet, total of 2.5 mg as needed at bedtime.    . vitamin B-12 (CYANOCOBALAMIN) 1000 MCG tablet Take by mouth.    . DULoxetine (CYMBALTA) 20 MG capsule Take 1 capsule (20 mg total) by mouth daily. 30 capsule 1   No current facility-administered medications for this visit.    Allergies  Allergen Reactions  . Lidocaine-Epinephrine Other (See Comments)    Patient states "nearly died"  . Paroxetine Palpitations  . Penicillins Other (See Comments)    Headache, blood pressure drops  . Sulfa Antibiotics Rash    Blotches  . Xylocaine  [Lidocaine Hcl]   . Risedronate     Skin growths      Discussed warning signs or symptoms. Please see discharge instructions. Patient expresses understanding.

## 2019-06-02 NOTE — Patient Instructions (Signed)
Thank you for coming in today. Start cymbalta.  Get xray now.  Get MRI soon.  I will go over results with you when they are back.   Duloxetine delayed-release capsules What is this medicine? DULOXETINE (doo LOX e teen) is used to treat depression, anxiety, and different types of chronic pain. This medicine may be used for other purposes; ask your health care provider or pharmacist if you have questions. COMMON BRAND NAME(S): Cymbalta, Creig Hines, Irenka What should I tell my health care provider before I take this medicine? They need to know if you have any of these conditions:  bipolar disorder  glaucoma  high blood pressure  kidney disease  liver disease  seizures  suicidal thoughts, plans or attempt; a previous suicide attempt by you or a family member  take medicines that treat or prevent blood clots  taken medicines called MAOIs like Carbex, Eldepryl, Marplan, Nardil, and Parnate within 14 days  trouble passing urine  an unusual reaction to duloxetine, other medicines, foods, dyes, or preservatives  pregnant or trying to get pregnant  breast-feeding How should I use this medicine? Take this medicine by mouth with a glass of water. Follow the directions on the prescription label. Do not crush, cut or chew some capsules of this medicine. Some capsules may be opened and sprinkled on applesauce. Check with your doctor or pharmacist if you are not sure. You can take this medicine with or without food. Take your medicine at regular intervals. Do not take your medicine more often than directed. Do not stop taking this medicine suddenly except upon the advice of your doctor. Stopping this medicine too quickly may cause serious side effects or your condition may worsen. A special MedGuide will be given to you by the pharmacist with each prescription and refill. Be sure to read this information carefully each time. Talk to your pediatrician regarding the use of this medicine in  children. While this drug may be prescribed for children as young as 12 years of age for selected conditions, precautions do apply. Overdosage: If you think you have taken too much of this medicine contact a poison control center or emergency room at once. NOTE: This medicine is only for you. Do not share this medicine with others. What if I miss a dose? If you miss a dose, take it as soon as you can. If it is almost time for your next dose, take only that dose. Do not take double or extra doses. What may interact with this medicine? Do not take this medicine with any of the following medications:  desvenlafaxine  levomilnacipran  linezolid  MAOIs like Carbex, Eldepryl, Marplan, Nardil, and Parnate  methylene blue (injected into a vein)  milnacipran  thioridazine  venlafaxine This medicine may also interact with the following medications:  alcohol  amphetamines  aspirin and aspirin-like medicines  certain antibiotics like ciprofloxacin and enoxacin  certain medicines for blood pressure, heart disease, irregular heart beat  certain medicines for depression, anxiety, or psychotic disturbances  certain medicines for migraine headache like almotriptan, eletriptan, frovatriptan, naratriptan, rizatriptan, sumatriptan, zolmitriptan  certain medicines that treat or prevent blood clots like warfarin, enoxaparin, and dalteparin  cimetidine  fentanyl  lithium  NSAIDS, medicines for pain and inflammation, like ibuprofen or naproxen  phentermine  procarbazine  rasagiline  sibutramine  St. John's wort  theophylline  tramadol  tryptophan This list may not describe all possible interactions. Give your health care provider a list of all the medicines, herbs, non-prescription drugs,  or dietary supplements you use. Also tell them if you smoke, drink alcohol, or use illegal drugs. Some items may interact with your medicine. What should I watch for while using this  medicine? Tell your doctor if your symptoms do not get better or if they get worse. Visit your doctor or healthcare provider for regular checks on your progress. Because it may take several weeks to see the full effects of this medicine, it is important to continue your treatment as prescribed by your doctor. This medicine may cause serious skin reactions. They can happen weeks to months after starting the medicine. Contact your healthcare provider right away if you notice fevers or flu-like symptoms with a rash. The rash may be red or purple and then turn into blisters or peeling of the skin. Or, you might notice a red rash with swelling of the face, lips, or lymph nodes in your neck or under your arms. Patients and their families should watch out for new or worsening thoughts of suicide or depression. Also watch out for sudden changes in feelings such as feeling anxious, agitated, panicky, irritable, hostile, aggressive, impulsive, severely restless, overly excited and hyperactive, or not being able to sleep. If this happens, especially at the beginning of treatment or after a change in dose, call your healthcare provider. You may get drowsy or dizzy. Do not drive, use machinery, or do anything that needs mental alertness until you know how this medicine affects you. Do not stand or sit up quickly, especially if you are an older patient. This reduces the risk of dizzy or fainting spells. Alcohol may interfere with the effect of this medicine. Avoid alcoholic drinks. This medicine can cause an increase in blood pressure. This medicine can also cause a sudden drop in your blood pressure, which may make you feel faint and increase the chance of a fall. These effects are most common when you first start the medicine or when the dose is increased, or during use of other medicines that can cause a sudden drop in blood pressure. Check with your doctor for instructions on monitoring your blood pressure while taking  this medicine. Your mouth may get dry. Chewing sugarless gum or sucking hard candy, and drinking plenty of water, may help. Contact your doctor if the problem does not go away or is severe. What side effects may I notice from receiving this medicine? Side effects that you should report to your doctor or health care professional as soon as possible:  allergic reactions like skin rash, itching or hives, swelling of the face, lips, or tongue  anxious  breathing problems  confusion  changes in vision  chest pain  confusion  elevated mood, decreased need for sleep, racing thoughts, impulsive behavior  eye pain  fast, irregular heartbeat  feeling faint or lightheaded, falls  feeling agitated, angry, or irritable  hallucination, loss of contact with reality  high blood pressure  loss of balance or coordination  palpitations  redness, blistering, peeling or loosening of the skin, including inside the mouth  restlessness, pacing, inability to keep still  seizures  stiff muscles  suicidal thoughts or other mood changes  trouble passing urine or change in the amount of urine  trouble sleeping  unusual bleeding or bruising  unusually weak or tired  vomiting  yellowing of the eyes or skin Side effects that usually do not require medical attention (report to your doctor or health care professional if they continue or are bothersome):  change in sex  drive or performance  change in appetite or weight  constipation  dizziness  dry mouth  headache  increased sweating  nausea  tired This list may not describe all possible side effects. Call your doctor for medical advice about side effects. You may report side effects to FDA at 1-800-FDA-1088. Where should I keep my medicine? Keep out of the reach of children. Store at room temperature between 15 and 30 degrees C (59 to 86 degrees F). Throw away any unused medicine after the expiration date. NOTE: This  sheet is a summary. It may not cover all possible information. If you have questions about this medicine, talk to your doctor, pharmacist, or health care provider.  2020 Elsevier/Gold Standard (2018-11-04 13:47:50)

## 2019-06-06 ENCOUNTER — Ambulatory Visit (INDEPENDENT_AMBULATORY_CARE_PROVIDER_SITE_OTHER): Payer: Medicare Other

## 2019-06-06 ENCOUNTER — Other Ambulatory Visit: Payer: Self-pay

## 2019-06-06 DIAGNOSIS — M542 Cervicalgia: Secondary | ICD-10-CM

## 2019-06-28 ENCOUNTER — Telehealth: Payer: Self-pay

## 2019-06-28 MED ORDER — FLUTICASONE PROPIONATE 50 MCG/ACT NA SUSP: NASAL | 3 refills | Status: AC

## 2019-06-28 NOTE — Telephone Encounter (Signed)
Can she be specific about what nasal spray she is talking about?  I do not see anything in my previous notes about this, or in medication records from outside offices.  Sounds like she could use a visit with sports medicine for further evaluation of neck pain issues, would advise that she follow-up with Dr. Darene Lamer, she had previously seen Dr. Georgina Snell and I am sure he would be happy to see her at his new location if that is convenient/preferred for her

## 2019-06-28 NOTE — Telephone Encounter (Signed)
Contacted pt regarding provider's note. Aware of provider's recommendation. Pt is requesting a rx for Fluticasone 50 mcg nasal spray. Pt mentioned that Dr. Georgina Snell prescribed low dose Cymbalta to help with arthritis pain in her neck.  As per pt, she was worried about taking the medication due to the vast amount of side effects med contains. She's had 4 cancer treatments and was concerned that Cymbalta may cause cancer to return. She also mentioned she is quite concern because her father had bone cancer and it started in his neck area as arthritis. Pt spoke with the pharmacist and it was determined that it would be safe for pt to take the med since it is at lowest dose. Pt will begin taking Cymbalta today. She has decided to hold off on making an appt with Sports Medicine. Pt will call to schedule a f/u prn.

## 2019-06-28 NOTE — Telephone Encounter (Signed)
Sent flonase then! Thanks!

## 2019-06-28 NOTE — Telephone Encounter (Signed)
Patient advised.

## 2019-06-28 NOTE — Telephone Encounter (Signed)
Pt left a vm msg stating she is having "really bad arthritis" pain in her neck area. Requesting recommendation from provider. Pt also mentioned in the msg that during her last visit she requested a refill for a nasal spray rx given to her by her previous provider. Requesting an update regarding the rx. Pls advise, thanks.

## 2019-07-21 DIAGNOSIS — D1801 Hemangioma of skin and subcutaneous tissue: Secondary | ICD-10-CM | POA: Diagnosis not present

## 2019-07-21 DIAGNOSIS — L820 Inflamed seborrheic keratosis: Secondary | ICD-10-CM | POA: Diagnosis not present

## 2019-07-21 DIAGNOSIS — C44319 Basal cell carcinoma of skin of other parts of face: Secondary | ICD-10-CM | POA: Diagnosis not present

## 2019-07-21 DIAGNOSIS — D485 Neoplasm of uncertain behavior of skin: Secondary | ICD-10-CM | POA: Diagnosis not present

## 2019-07-21 DIAGNOSIS — L853 Xerosis cutis: Secondary | ICD-10-CM | POA: Diagnosis not present

## 2019-08-15 ENCOUNTER — Other Ambulatory Visit: Payer: Self-pay | Admitting: Osteopathic Medicine

## 2019-08-18 ENCOUNTER — Ambulatory Visit (INDEPENDENT_AMBULATORY_CARE_PROVIDER_SITE_OTHER): Payer: Medicare Other | Admitting: Family Medicine

## 2019-08-18 ENCOUNTER — Encounter: Payer: Self-pay | Admitting: Family Medicine

## 2019-08-18 ENCOUNTER — Other Ambulatory Visit: Payer: Self-pay

## 2019-08-18 VITALS — BP 130/59 | HR 91 | Ht 64.0 in | Wt 162.0 lb

## 2019-08-18 DIAGNOSIS — M542 Cervicalgia: Secondary | ICD-10-CM | POA: Diagnosis not present

## 2019-08-18 MED ORDER — GABAPENTIN 100 MG PO CAPS: 100.0000 mg | ORAL_CAPSULE | Freq: Every day | ORAL | 1 refills | Status: DC

## 2019-08-18 NOTE — Progress Notes (Signed)
Acute Office Visit  Subjective:    Patient ID: Barbara Simpson, female    DOB: Mar 06, 1941, 78 y.o.   MRN: [id number]  Chief Complaint  Patient presents with  . Neck Pain    HPI Patient is in today for neck pain, 8/10.  She has actually been seen for this a couple of different times in fact she was last seen for in October by Dr. Lynne Leader one of our sports medicine providers.  She does have known degenerative disc disease of the cervical spine and has tried physical therapy previously with 6 sessions in June.  She had an MRI in October which showed some diffuse arthritis of the cervical spine with multilevel stenosis foraminal stenosis.  She was recommended referral to Dr. Francesco Runner in [place] for injection.  She felt like the injections were in a dangerous area and was not willing to take that risk and so opted out.  She is mostly just been doing conservative care with ice, heat, Sabra topical ointment and taking extra strength Tylenol.  She actually had a shoulder surgery and has hardware in her right shoulder going up towards her neck and wonders if sometimes it actually is contributing to some of her discomfort and pain.  She said she did try the Cymbalta back in October but it actually made her heart race. She would like to try something else for her pain    She does get some relief with massage but hasn't schedule with a therapist for massage.    Past Medical History:  Diagnosis Date  . Age-related osteoporosis without current pathological fracture 08/03/2018  . Anxiety   . Breast cancer ([place])   . Generalized anxiety disorder 08/03/2018  . H/O total hysterectomy 08/03/2018  . High blood pressure   . History of breast cancer 08/03/2018  . History of primary hyperparathyroidism 07/20/2018  . Hypothyroidism   . 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 Thyroid cancer ([place]) 07/20/2018    Past Surgical History:  Procedure Laterality Date  . APPENDECTOMY    . BROKEN BONE  REPAIR    . CATARACT EXTRACTION, BILATERAL    . HYSTERECTOMY ABDOMINAL WITH SALPINGECTOMY    . NERVE REPAIR     LEFT ARM  . OOPHORECTOMY    . THYROID SURGERY    . TOTAL SHOULDER REPLACEMENT Right     No family history on file.  Social History   Socioeconomic History  . Marital status: Married    Spouse name: Not on file  . Number of children: Not on file  . Years of education: Not on file  . Highest education level: Not on file  Occupational History  . Not on file  Tobacco Use  . Smoking status: Never Smoker  . Smokeless tobacco: Never Used  Substance and Sexual Activity  . Alcohol use: Never  . Drug use: Never  . Sexual activity: Not Currently    Partners: Male  Other Topics Concern  . Not on file  Social History Narrative  . Not on file   Social Determinants of Health   Financial Resource Strain:   . Difficulty of Paying Living Expenses: Not on file  Food Insecurity:   . Worried About Charity fundraiser in the Last Year: Not on file  . Ran Out of Food in the Last Year: Not on file  Transportation Needs:   . Lack of Transportation (Medical): Not on file  . Lack of Transportation (Non-Medical): Not on file  Physical Activity:   .  Days of Exercise per Week: Not on file  . Minutes of Exercise per Session: Not on file  Stress:   . Feeling of Stress : Not on file  Social Connections:   . Frequency of Communication with Friends and Family: Not on file  . Frequency of Social Gatherings with Friends and Family: Not on file  . Attends Religious Services: Not on file  . Active Member of Clubs or Organizations: Not on file  . Attends Archivist Meetings: Not on file  . Marital Status: Not on file  Intimate Partner Violence:   . Fear of Current or Ex-Partner: Not on file  . Emotionally Abused: Not on file  . Physically Abused: Not on file  . Sexually Abused: Not on file    Outpatient Medications Prior to Visit  Medication Sig Dispense Refill  .  carvedilol (COREG) 12.5 MG tablet TAKE ONE TABLET BY MOUTH TWICE A DAY WITH A MEAL 180 tablet 0  . Cholecalciferol (VITAMIN D3) 50 MCG (2000 UT) TABS Take 50 mcg by mouth daily. 90 tablet 3  . DULoxetine (CYMBALTA) 20 MG capsule Take 1 capsule (20 mg total) by mouth daily. 30 capsule 1  . fluticasone (FLONASE) 50 MCG/ACT nasal spray One spray in each nostril twice a day, use left hand for right nostril, and right hand for left nostril. 48 g 3  . levothyroxine (SYNTHROID, LEVOTHROID) 50 MCG tablet Take 1 tablet (50 mcg total) by mouth daily before breakfast. 90 tablet 3  . magnesium oxide (MAG-OX) 400 MG tablet Take by mouth.    . Melatonin 5 MG TABS Take 2.5 mg by mouth as needed. Takes 1/2 tablet, total of 2.5 mg as needed at bedtime.    . vitamin B-12 (CYANOCOBALAMIN) 1000 MCG tablet Take by mouth.     No facility-administered medications prior to visit.    Allergies  Allergen Reactions  . Lidocaine-Epinephrine Other (See Comments)    Patient states "nearly died"  . Paroxetine Palpitations  . Penicillins Other (See Comments)    Headache, blood pressure drops  . Sulfa Antibiotics Rash    Blotches  . Cymbalta [Duloxetine Hcl] Other (See Comments)    Palpitations    . Xylocaine  [Lidocaine Hcl]   . Risedronate     Skin growths    Review of Systems     Objective:    Physical Exam Vitals reviewed.  Constitutional:      Appearance: She is well-developed.  HENT:     Head: Normocephalic and atraumatic.  Eyes:     Conjunctiva/sclera: Conjunctivae normal.  Cardiovascular:     Rate and Rhythm: Normal rate.  Pulmonary:     Effort: Pulmonary effort is normal.  Musculoskeletal:     Comments: Decreased cervical flexion and extension.  Decreased rotation right and left but she has more limitation with rotation to the right compared to the left.  Decreased sidebending.  Nontender over the cervical spine but she is tender over the right paracervical muscles.  Skin:    General: Skin is  dry.     Coloration: Skin is not pale.  Neurological:     Mental Status: She is alert and oriented to person, place, and time.  Psychiatric:        Behavior: Behavior normal.     BP (!) 130/59   Pulse 91   Ht 5\' 4"  (1.626 m)   Wt 162 lb (73.5 kg)   SpO2 98%   BMI 27.81 kg/m  Wt Readings from  Last 3 Encounters:  08/18/19 162 lb (73.5 kg)  06/02/19 161 lb (73 kg)  03/03/19 158 lb (71.7 kg)    Health Maintenance Due  Topic Date Due  . DEXA SCAN  03/25/2008  . PNA vac Low Risk Adult (1 of 2 - PCV13) 03/25/2008    There are no preventive care reminders to display for this patient.   Lab Results  Component Value Date   TSH 2.32 12/23/2018   Lab Results  Component Value Date   WBC 8.9 03/03/2019   HGB 12.6 03/03/2019   HCT 38.6 03/03/2019   MCV 85.4 03/03/2019   PLT 275 03/03/2019   Lab Results  Component Value Date   NA 130 (L) 03/03/2019   K 4.7 03/03/2019   CO2 30 03/03/2019   GLUCOSE 97 03/03/2019   BUN 9 03/03/2019   CREATININE 0.69 03/03/2019   BILITOT 0.5 03/03/2019   ALKPHOS 75 03/03/2019   AST 10 (L) 03/03/2019   ALT 7 03/03/2019   PROT 6.2 (L) 03/03/2019   ALBUMIN 4.1 03/03/2019   CALCIUM 8.2 (L) 03/03/2019   ANIONGAP 7 03/03/2019   No results found for: CHOL No results found for: HDL No results found for: LDLCALC No results found for: TRIG No results found for: CHOLHDL No results found for: HGBA1C     Assessment & Plan:   Problem List Items Addressed This Visit      Other   Cervicalgia - Primary    Chronic right-sided neck pain-we discussed nonmedica nonmedication strategies.  She did not get a lot of improvement with feet PT but felt like she got good relief with massage therapy.  We discussed finding someone locally that might be helpful for her who is priced reasonably or even checking with her insurance to see if they will cover it as long as she has an order or prescription from her provider.  If this can help reduce her need for  medications I think this would be extremely beneficial.  She has a TENS unit at home which she had actually used for her shoulder after her surgery and encouraged her to try using it on her neck it can be really helpful.  She did not tolerate Cymbalta as she had palpitations on it so we discussed other options such as gabapentin maybe even amitriptyline.  We will start with gabapentin.  Did warn about potential for sedation we will start with just 1 tab at bedtime and after 10 days if she is doing well she can try to increase to twice a day if she would like.  Did encourage her to follow with her PCP in about 4 weeks.          Meds ordered this encounter  Medications  . gabapentin (NEURONTIN) 100 MG capsule    Sig: Take 1 capsule (100 mg total) by mouth at bedtime. After 10 days ok to increase to increase to BID    Dispense:  60 capsule    Refill:  1     Beatrice Lecher, MD

## 2019-08-18 NOTE — Assessment & Plan Note (Signed)
Chronic right-sided neck pain-we discussed nonmedica nonmedication strategies.  She did not get a lot of improvement with feet PT but felt like she got good relief with massage therapy.  We discussed finding someone locally that might be helpful for her who is priced reasonably or even checking with her insurance to see if they will cover it as long as she has an order or prescription from her provider.  If this can help reduce her need for medications I think this would be extremely beneficial.  She has a TENS unit at home which she had actually used for her shoulder after her surgery and encouraged her to try using it on her neck it can be really helpful.  She did not tolerate Cymbalta as she had palpitations on it so we discussed other options such as gabapentin maybe even amitriptyline.  We will start with gabapentin.  Did warn about potential for sedation we will start with just 1 tab at bedtime and after 10 days if she is doing well she can try to increase to twice a day if she would like.  Did encourage her to follow with her PCP in about 4 weeks.

## 2019-09-01 ENCOUNTER — Encounter: Payer: Self-pay | Admitting: Family

## 2019-09-01 ENCOUNTER — Telehealth: Payer: Self-pay | Admitting: Family

## 2019-09-01 ENCOUNTER — Other Ambulatory Visit: Payer: Self-pay

## 2019-09-01 ENCOUNTER — Inpatient Hospital Stay: Payer: Medicare Other

## 2019-09-01 ENCOUNTER — Inpatient Hospital Stay: Payer: Medicare Other | Attending: Family | Admitting: Family

## 2019-09-01 VITALS — BP 132/73 | HR 83 | Temp 97.8°F | Resp 18 | Ht 64.0 in | Wt 162.4 lb

## 2019-09-01 DIAGNOSIS — Z853 Personal history of malignant neoplasm of breast: Secondary | ICD-10-CM

## 2019-09-01 DIAGNOSIS — Z9223 Personal history of estrogen therapy: Secondary | ICD-10-CM | POA: Insufficient documentation

## 2019-09-01 DIAGNOSIS — Z9013 Acquired absence of bilateral breasts and nipples: Secondary | ICD-10-CM | POA: Diagnosis not present

## 2019-09-01 DIAGNOSIS — Z170 Estrogen receptor positive status [ER+]: Secondary | ICD-10-CM | POA: Insufficient documentation

## 2019-09-01 LAB — CBC WITH DIFFERENTIAL (CANCER CENTER ONLY)
Abs Immature Granulocytes: 0.03 10*3/uL (ref 0.00–0.07)
Basophils Absolute: 0.1 10*3/uL (ref 0.0–0.1)
Basophils Relative: 1 %
Eosinophils Absolute: 0.2 10*3/uL (ref 0.0–0.5)
Eosinophils Relative: 3 %
HCT: 41.3 % (ref 36.0–46.0)
Hemoglobin: 13.3 g/dL (ref 12.0–15.0)
Immature Granulocytes: 0 %
Lymphocytes Relative: 23 %
Lymphs Abs: 1.6 10*3/uL (ref 0.7–4.0)
MCH: 27.8 pg (ref 26.0–34.0)
MCHC: 32.2 g/dL (ref 30.0–36.0)
MCV: 86.4 fL (ref 80.0–100.0)
Monocytes Absolute: 0.6 10*3/uL (ref 0.1–1.0)
Monocytes Relative: 9 %
Neutro Abs: 4.6 10*3/uL (ref 1.7–7.7)
Neutrophils Relative %: 64 %
Platelet Count: 294 10*3/uL (ref 150–400)
RBC: 4.78 MIL/uL (ref 3.87–5.11)
RDW: 14.6 % (ref 11.5–15.5)
WBC Count: 7.1 10*3/uL (ref 4.0–10.5)
nRBC: 0 % (ref 0.0–0.2)

## 2019-09-01 LAB — CMP (CANCER CENTER ONLY)
ALT: 11 U/L (ref 0–44)
AST: 11 U/L — ABNORMAL LOW (ref 15–41)
Albumin: 4.5 g/dL (ref 3.5–5.0)
Alkaline Phosphatase: 68 U/L (ref 38–126)
Anion gap: 5 (ref 5–15)
BUN: 16 mg/dL (ref 8–23)
CO2: 32 mmol/L (ref 22–32)
Calcium: 9.4 mg/dL (ref 8.9–10.3)
Chloride: 97 mmol/L — ABNORMAL LOW (ref 98–111)
Creatinine: 0.71 mg/dL (ref 0.44–1.00)
GFR, Est AFR Am: >60 mL/min (ref >60)
GFR, Estimated: >60 mL/min (ref >60)
Glucose, Bld: 105 mg/dL — ABNORMAL HIGH (ref 70–99)
Potassium: 5.1 mmol/L (ref 3.5–5.1)
Sodium: 134 mmol/L — ABNORMAL LOW (ref 135–145)
Total Bilirubin: 0.4 mg/dL (ref 0.3–1.2)
Total Protein: 6.7 g/dL (ref 6.5–8.1)

## 2019-09-01 NOTE — Progress Notes (Signed)
Hematology and Oncology Follow Up Visit  Barbara Simpson [id number] Dec 24, 1940 79 y.o. 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 She is doing quite well and has no complaints at this time.  Bilateral breast and axillary exam was negative.  She had a spot removed by dermatology from under the left breast which she states was benign. The incision site has healed nicely. She follows up with them regularly for skin checks.  She has occasional episodes of dizziness and is unsure if this related to her Coreg. She plans to discuss this with her PCP at her scheduled visit on 1/28.   No fever, chills, n/v, cough, rash, SOB, chest pain, palpitations, abdominal pain or changes in bowel or bladder habits.  No swelling, tenderness, numbness or tingling in her extremities.  No recent falls or syncopal episodes.  She has maintained a good appetite and is staying well hydrated. Her weight is stable.   ECOG Performance Status: 1 - Symptomatic but completely ambulatory  Medications:  Allergies as of 09/01/2019      Reactions   Lidocaine-epinephrine Other (See Comments)   Patient states "nearly died"   Paroxetine Palpitations   Penicillins Other (See Comments)   Headache, blood pressure drops   Sulfa Antibiotics Rash   Blotches   Cymbalta [duloxetine Hcl] Other (See Comments)   Palpitations     Xylocaine  [lidocaine Hcl]    Risedronate    Skin growths      Medication List       Accurate as of September 01, 2019 10:36 AM. If you have any questions, ask your nurse or doctor.        carvedilol 12.5 MG tablet Commonly known as: COREG TAKE ONE TABLET BY MOUTH TWICE A DAY WITH A MEAL   DULoxetine 20 MG capsule Commonly known as: CYMBALTA Take 1 capsule  (20 mg total) by mouth daily.   fluticasone 50 MCG/ACT nasal spray Commonly known as: Flonase One spray in each nostril twice a day, use left hand for right nostril, and right hand for left nostril.   gabapentin 100 MG capsule Commonly known as: NEURONTIN Take 1 capsule (100 mg total) by mouth at bedtime. After 10 days ok to increase to increase to BID   levothyroxine 50 MCG tablet Commonly known as: SYNTHROID Take 1 tablet (50 mcg total) by mouth daily before breakfast.   magnesium oxide 400 MG tablet Commonly known as: MAG-OX Take by mouth.   Melatonin 5 MG Tabs Take 2.5 mg by mouth as needed. Takes 1/2 tablet, total of 2.5 mg as needed at bedtime.   vitamin B-12 1000 MCG tablet Commonly known as: CYANOCOBALAMIN Take by mouth.   Vitamin D3 50 MCG (2000 UT) Tabs Take 50 mcg by mouth daily.       Allergies:  Allergies  Allergen Reactions  . Lidocaine-Epinephrine Other (See Comments)    Patient states "nearly died"  . Paroxetine Palpitations  . Penicillins Other (See Comments)    Headache, blood pressure drops  . Sulfa Antibiotics Rash    Blotches  . Cymbalta [Duloxetine Hcl] Other (See Comments)    Palpitations    . Xylocaine  [Lidocaine Hcl]   . Risedronate     Skin growths  Past Medical History, Surgical history, Social history, and Family History were reviewed and updated.  Review of Systems: All other 10 point review of systems is negative.   Physical Exam:  vitals were not taken for this visit.   Wt Readings from Last 3 Encounters:  08/18/19 162 lb (73.5 kg)  06/02/19 161 lb (73 kg)  03/03/19 158 lb (71.7 kg)    Ocular: Sclerae unicteric, pupils equal, round and reactive to light Ear-nose-throat: Oropharynx clear, dentition fair Lymphatic: No cervical, supraclavicular or axillary adenopathy Lungs no rales or rhonchi, good excursion bilaterally Heart regular rate and rhythm, no murmur appreciated Abd soft, nontender, positive bowel sounds, no  liver or spleen tip palpated on exam, no fluid wave  MSK no focal spinal tenderness, no joint edema Neuro: non-focal, well-oriented, appropriate affect Breasts: Bilateral mastectomies with reconstruction intact. No mass, lesion or rash, noted.  Lab Results  Component Value Date   WBC 8.9 03/03/2019   HGB 12.6 03/03/2019   HCT 38.6 03/03/2019   MCV 85.4 03/03/2019   PLT 275 03/03/2019   No results found for: FERRITIN, IRON, TIBC, UIBC, IRONPCTSAT Lab Results  Component Value Date   RBC 4.52 03/03/2019   No results found for: KPAFRELGTCHN, LAMBDASER, KAPLAMBRATIO No results found for: IGGSERUM, IGA, IGMSERUM No results found for: Ronnald Ramp, A1GS, A2GS, Arnaldo Natal, GAMS, MSPIKE, SPEI   Chemistry      Component Value Date/Time   NA 130 (L) 03/03/2019 1126   K 4.7 03/03/2019 1126   CL 93 (L) 03/03/2019 1126   CO2 30 03/03/2019 1126   BUN 9 03/03/2019 1126   CREATININE 0.69 03/03/2019 1126   CREATININE 0.60 12/23/2018 1114      Component Value Date/Time   CALCIUM 8.2 (L) 03/03/2019 1126   ALKPHOS 75 03/03/2019 1126   AST 10 (L) 03/03/2019 1126   ALT 7 03/03/2019 1126   BILITOT 0.5 03/03/2019 1126       Impression and Plan: Barbara Simpson is a very pleasant 79 yo caucasian female with history of left breast cancer with bilateral mastectomies in 2014 with reconstruction. No chemo or radiation. She completed 5 years of Arimidex in January 2020.  She continues to do well and has no complaints at this time.  We will plan to see her back in another year for her survivorship program follow-up.  She will contact our office with any questions or concerns. We can certainly see her sooner if needed.   Barbara Peace, NP 1/14/202110:36 AM

## 2019-09-01 NOTE — Telephone Encounter (Signed)
Appointments scheduled and my chart email was sent to her as well as requested by patient.  Calendar printed per 1/14 los

## 2019-09-15 ENCOUNTER — Ambulatory Visit (INDEPENDENT_AMBULATORY_CARE_PROVIDER_SITE_OTHER): Payer: Medicare Other | Admitting: Osteopathic Medicine

## 2019-09-15 ENCOUNTER — Encounter: Payer: Self-pay | Admitting: Osteopathic Medicine

## 2019-09-15 ENCOUNTER — Other Ambulatory Visit: Payer: Self-pay

## 2019-09-15 VITALS — BP 144/91 | HR 78 | Temp 98.1°F | Wt 162.1 lb

## 2019-09-15 DIAGNOSIS — E890 Postprocedural hypothyroidism: Secondary | ICD-10-CM

## 2019-09-15 DIAGNOSIS — M810 Age-related osteoporosis without current pathological fracture: Secondary | ICD-10-CM

## 2019-09-15 DIAGNOSIS — Z8639 Personal history of other endocrine, nutritional and metabolic disease: Secondary | ICD-10-CM | POA: Diagnosis not present

## 2019-09-15 DIAGNOSIS — Z853 Personal history of malignant neoplasm of breast: Secondary | ICD-10-CM | POA: Diagnosis not present

## 2019-09-15 DIAGNOSIS — M542 Cervicalgia: Secondary | ICD-10-CM | POA: Diagnosis not present

## 2019-09-15 DIAGNOSIS — Z90710 Acquired absence of both cervix and uterus: Secondary | ICD-10-CM | POA: Diagnosis not present

## 2019-09-15 DIAGNOSIS — F411 Generalized anxiety disorder: Secondary | ICD-10-CM | POA: Diagnosis not present

## 2019-09-15 NOTE — Progress Notes (Signed)
Barbara Simpson is a 79 y.o. female who presents to  [place] [facility]  today, 09/15/19, seeking care for the following:  The primary encounter diagnosis was Cervicalgia. Diagnoses of Postoperative hypothyroidism, Generalized anxiety disorder, H/O total hysterectomy, History of breast cancer, Age-related osteoporosis without current pathological fracture, and History of primary hyperparathyroidism were also pertinent to this visit.    Patient reports neck pain is stable, using some stretching/heat and Tylenol as needed.  Seen by 2 of my colleagues in the past few months for this issue, referred back to me for follow-up.  Patient has no other complaints or concerns today.  Not interested in further work-up/interventions at this point.   CBC/CMP reviewed from recent visit with oncology, no concerns. Thyroid managed by endocrinology.  Orders Placed This Encounter  Procedures  . Lipid panel  . VITAMIN D 25 Hydroxy (Vit-D Deficiency, Fractures)    No orders of the defined types were placed in this encounter.   There are no Patient Instructions on file for this visit.    Follow-up instructions: Return in about 6 months (around 03/14/2020) for [place] - sooner if needed .       BP (!) 144/91 (BP Location: Left Arm, Patient Position: Sitting, Cuff Size: Normal)   Pulse 78   Temp 98.1 F (36.7 C) (Oral)   Wt 162 lb 1.9 oz (73.5 kg)   BMI 27.83 kg/m   Current Meds  Medication Sig  . carvedilol (COREG) 12.5 MG tablet TAKE ONE TABLET BY MOUTH TWICE A DAY WITH A MEAL  . Cholecalciferol (VITAMIN D3) 50 MCG (2000 UT) TABS Take 50 mcg by mouth daily.  . fluticasone (FLONASE) 50 MCG/ACT nasal spray One spray in each nostril twice a day, use left hand for right nostril, and right hand for left nostril.  Marland Kitchen levothyroxine (SYNTHROID, LEVOTHROID) 50 MCG tablet Take 1 tablet (50 mcg total) by mouth daily before  breakfast.  . magnesium oxide (MAG-OX) 400 MG tablet Take by mouth.  . Melatonin 5 MG TABS Take 2.5 mg by mouth as needed. Takes 1/2 tablet, total of 2.5 mg as needed at bedtime.  . vitamin B-12 (CYANOCOBALAMIN) 1000 MCG tablet Take by mouth.    No results found for this or any previous visit (from the past 72 hour(s)).  No results found.  Depression screen [facility] 2/9 08/30/2018 08/02/2018  Decreased Interest 0 3  Down, Depressed, Hopeless 0 1  PHQ - 2 Score 0 4  Altered sleeping 0 2  Tired, decreased energy 2 3  Change in appetite 0 1  Feeling bad or failure about yourself  0 2  Trouble concentrating 0 1  Moving slowly or fidgety/restless 0 0  Suicidal thoughts - 0  PHQ-9 Score 2 13  Difficult doing work/chores Not difficult at all Somewhat difficult    GAD 7 : Generalized Anxiety Score 08/30/2018 08/02/2018  Nervous, Anxious, on Edge 0 2  Control/stop worrying 1 3  Worry too much - different things 0 3  Trouble relaxing 0 2  Restless 0 1  Easily annoyed or irritable 0 2  Afraid - awful might happen 0 0  Total GAD 7 Score 1 13  Anxiety Difficulty Not difficult at all Somewhat difficult      All questions at time of visit were answered - patient instructed to contact office with any additional concerns or updates.  ER/RTC precautions were reviewed with the patient.  Please note:  voice recognition software was used to produce this document, and typos may escape review. Please contact Dr. Sheppard Coil for any needed clarifications.   Total encounter time: 20 minutes.

## 2019-10-09 ENCOUNTER — Ambulatory Visit: Payer: Medicare Other | Attending: Internal Medicine

## 2019-10-09 DIAGNOSIS — Z23 Encounter for immunization: Secondary | ICD-10-CM | POA: Insufficient documentation

## 2019-10-09 NOTE — Progress Notes (Signed)
   Covid-19 Vaccination Clinic  Name:  BLUMA TOBE    MRN: [id number] DOB: [birth year]  10/09/2019  Ms. Haubert was observed post Covid-19 immunization for 15 minutes without incidence. She was provided with Vaccine Information Sheet and instruction to access the V-Safe system.   Ms. Grunder was instructed to call 911 with any severe reactions post vaccine: Marland Kitchen Difficulty breathing  . Swelling of your face and throat  . A fast heartbeat  . A bad rash all over your body  . Dizziness and weakness    Immunizations Administered    Name Date Dose VIS Date Route   Pfizer COVID-19 Vaccine 10/09/2019  9:47 AM 0.3 mL 07/29/2019 Intramuscular   Manufacturer: [place]   Lot: [id number]   [place]: [id number]

## 2019-11-02 ENCOUNTER — Ambulatory Visit: Payer: Medicare Other | Attending: Internal Medicine

## 2019-11-02 DIAGNOSIS — Z23 Encounter for immunization: Secondary | ICD-10-CM

## 2019-11-02 NOTE — Progress Notes (Signed)
   Covid-19 Vaccination Clinic  Name:  Barbara Simpson    MRN: [id number] DOB: 1940-11-20  11/02/2019  Ms. Billing was observed post Covid-19 immunization for 15 minutes without incident. She was provided with Vaccine Information Sheet and instruction to access the V-Safe system.   [name] was instructed to call 911 with any severe reactions post vaccine: Marland Kitchen Difficulty breathing  . Swelling of face and throat  . A fast heartbeat  . A bad rash all over body  . Dizziness and weakness   Immunizations Administered    Name Date Dose VIS Date Route   Pfizer COVID-19 Vaccine 11/02/2019  9:59 AM 0.3 mL 07/29/2019 Intramuscular   Manufacturer: [place]   Lot: [id number]   [place]: [id number]

## 2019-11-14 ENCOUNTER — Other Ambulatory Visit: Payer: Self-pay | Admitting: Osteopathic Medicine

## 2019-11-15 ENCOUNTER — Encounter: Payer: Self-pay | Admitting: Sports Medicine

## 2019-11-15 ENCOUNTER — Ambulatory Visit (INDEPENDENT_AMBULATORY_CARE_PROVIDER_SITE_OTHER): Payer: Medicare Other | Admitting: Sports Medicine

## 2019-11-15 ENCOUNTER — Other Ambulatory Visit: Payer: Self-pay

## 2019-11-15 ENCOUNTER — Ambulatory Visit (INDEPENDENT_AMBULATORY_CARE_PROVIDER_SITE_OTHER): Payer: Medicare Other

## 2019-11-15 DIAGNOSIS — M19072 Primary osteoarthritis, left ankle and foot: Secondary | ICD-10-CM

## 2019-11-15 DIAGNOSIS — M79672 Pain in left foot: Secondary | ICD-10-CM | POA: Diagnosis not present

## 2019-11-15 MED ORDER — ACETAMINOPHEN ER 650 MG PO TBCR: 650.0000 mg | EXTENDED_RELEASE_TABLET | Freq: Three times a day (TID) | ORAL | 3 refills | Status: DC | PRN

## 2019-11-15 NOTE — Progress Notes (Signed)
    Procedures performed today:    Procedure: Real-time Ultrasound Guided injection of the left calcaneocuboid joint Device: Samsung [id number]  Verbal informed consent obtained.  Time-out conducted.  Noted no overlying erythema, induration, or other signs of local infection.  Skin prepped in a sterile fashion.  Local anesthesia: Topical Ethyl chloride.  With sterile technique and under real time ultrasound guidance:  1 cc lidocaine, 1 cc Kenalog 40 injected easily completed without difficulty  Pain immediately resolved suggesting accurate placement of the medication.  Advised to call if fevers/chills, erythema, induration, drainage, or persistent bleeding.  Images permanently stored and available for review in the ultrasound unit.  Impression: Technically successful ultrasound guided injection.  Independent interpretation of notes and tests performed by another provider:   None.  Brief History, Exam, Impression, and Recommendations:    Primary osteoarthritis of left foot Barbara Simpson returns, she is a pleasant 79 year old female with left foot pain, chronic, she has seen Dr. Georgina Snell in the past, he did a calcaneocuboid injection back in June of last year that did well.  She is now having recurrence of pain at the calcaneocuboid joint, repeat calcaneocuboid joint injection performed today, updated x-rays ordered. She did have some pain around the lateral tibiotalar joint as well which we can use a secondary pain generator and a target at the follow-up visit if need be. She can use arthritis strength Tylenol in the meantime.    ___________________________________________ Barbara Simpson. Dianah Field, M.D., ABFM., CAQSM. Primary Care and [place] Instructor of [place] of [facility] of Medicine

## 2019-11-15 NOTE — Assessment & Plan Note (Signed)
Barbara Simpson returns, she is a pleasant 79 year old female with left foot pain, chronic, she has seen Dr. Georgina Snell in the past, he did a calcaneocuboid injection back in June of last year that did well.  She is now having recurrence of pain at the calcaneocuboid joint, repeat calcaneocuboid joint injection performed today, updated x-rays ordered. She did have some pain around the lateral tibiotalar joint as well which we can use a secondary pain generator and a target at the follow-up visit if need be. She can use arthritis strength Tylenol in the meantime.

## 2019-11-28 DIAGNOSIS — Z853 Personal history of malignant neoplasm of breast: Secondary | ICD-10-CM | POA: Diagnosis not present

## 2019-11-28 DIAGNOSIS — M542 Cervicalgia: Secondary | ICD-10-CM | POA: Diagnosis not present

## 2019-11-28 DIAGNOSIS — Z8639 Personal history of other endocrine, nutritional and metabolic disease: Secondary | ICD-10-CM | POA: Diagnosis not present

## 2019-11-28 DIAGNOSIS — M810 Age-related osteoporosis without current pathological fracture: Secondary | ICD-10-CM | POA: Diagnosis not present

## 2019-11-28 DIAGNOSIS — Z90710 Acquired absence of both cervix and uterus: Secondary | ICD-10-CM | POA: Diagnosis not present

## 2019-11-28 DIAGNOSIS — E890 Postprocedural hypothyroidism: Secondary | ICD-10-CM | POA: Diagnosis not present

## 2019-11-28 DIAGNOSIS — F411 Generalized anxiety disorder: Secondary | ICD-10-CM | POA: Diagnosis not present

## 2019-11-29 LAB — VITAMIN D 25 HYDROXY (VIT D DEFICIENCY, FRACTURES): Vit D, 25-Hydroxy: 58 ng/mL (ref 30–100)

## 2019-12-07 ENCOUNTER — Ambulatory Visit (INDEPENDENT_AMBULATORY_CARE_PROVIDER_SITE_OTHER): Payer: Medicare Other | Admitting: Sports Medicine

## 2019-12-07 ENCOUNTER — Other Ambulatory Visit: Payer: Self-pay

## 2019-12-07 ENCOUNTER — Encounter: Payer: Self-pay | Admitting: Sports Medicine

## 2019-12-07 DIAGNOSIS — M19072 Primary osteoarthritis, left ankle and foot: Secondary | ICD-10-CM | POA: Diagnosis not present

## 2019-12-07 MED ORDER — TRAMADOL HCL 50 MG PO TABS: 50.0000 mg | ORAL_TABLET | Freq: Three times a day (TID) | ORAL | 0 refills | Status: DC | PRN

## 2019-12-07 NOTE — Progress Notes (Signed)
    Procedures performed today:    None.  Independent interpretation of notes and tests performed by another provider:   None.  Brief History, Exam, Impression, and Recommendations:    Primary osteoarthritis of left foot This pleasant 79 year old female returns, she has left midfoot osteoarthritis, chronic, she had a calcaneocuboid injection back in June of last year that did well, at the last visit we reinjected her calcaneocuboid joint, she returns today unfortunately with persistence and recurrence of discomfort. She does have a significant tender spur over the calcaneocuboid joint. She did feel good the day of the injection. We are going to proceed with boot immobilization, she has one at home. I am also going to proceed with MRI of the left foot. Adding some tramadol to use as needed. Return to see me in 1 month.    ___________________________________________ Gwen Her. Dianah Field, M.D., ABFM., CAQSM. Primary Care and [place] Instructor of [place] of [facility] of Medicine

## 2019-12-07 NOTE — Assessment & Plan Note (Signed)
This pleasant 79 year old female returns, she has left midfoot osteoarthritis, chronic, she had a calcaneocuboid injection back in June of last year that did well, at the last visit we reinjected her calcaneocuboid joint, she returns today unfortunately with persistence and recurrence of discomfort. She does have a significant tender spur over the calcaneocuboid joint. She did feel good the day of the injection. We are going to proceed with boot immobilization, she has one at home. I am also going to proceed with MRI of the left foot. Adding some tramadol to use as needed. Return to see me in 1 month.

## 2019-12-13 ENCOUNTER — Ambulatory Visit: Payer: Medicare Other | Admitting: Sports Medicine

## 2019-12-17 ENCOUNTER — Other Ambulatory Visit: Payer: Self-pay

## 2019-12-17 ENCOUNTER — Ambulatory Visit (INDEPENDENT_AMBULATORY_CARE_PROVIDER_SITE_OTHER): Payer: Medicare Other

## 2019-12-17 DIAGNOSIS — M19072 Primary osteoarthritis, left ankle and foot: Secondary | ICD-10-CM | POA: Diagnosis not present

## 2019-12-20 ENCOUNTER — Ambulatory Visit: Payer: Medicare Other

## 2019-12-20 ENCOUNTER — Other Ambulatory Visit: Payer: Self-pay

## 2019-12-28 ENCOUNTER — Ambulatory Visit: Payer: Medicare Other | Admitting: Sports Medicine

## 2020-01-25 ENCOUNTER — Telehealth: Payer: Self-pay | Admitting: *Deleted

## 2020-01-25 NOTE — Telephone Encounter (Signed)
Call received to scheduler from patient stating that she needs to make an appt to see Dr. Marin Olp d/t one breast is larger than the other and d/t increased pain to that breast as well.  Dr. Marin Olp notified and would like to see patient on 01/27/20. Patient notified and message sent to scheduling.

## 2020-01-26 ENCOUNTER — Telehealth: Payer: Self-pay | Admitting: Hematology & Oncology

## 2020-01-26 NOTE — Telephone Encounter (Signed)
Called and spoke with patient regarding appointment added per 6/9 sch msg

## 2020-01-27 ENCOUNTER — Other Ambulatory Visit: Payer: Self-pay

## 2020-01-27 ENCOUNTER — Inpatient Hospital Stay: Payer: Medicare Other | Attending: Hematology & Oncology | Admitting: Hematology & Oncology

## 2020-01-27 ENCOUNTER — Encounter: Payer: Self-pay | Admitting: Hematology & Oncology

## 2020-01-27 ENCOUNTER — Other Ambulatory Visit: Payer: Medicare Other

## 2020-01-27 VITALS — BP 137/60 | HR 89 | Temp 97.6°F | Resp 18 | Ht 64.0 in | Wt 160.1 lb

## 2020-01-27 DIAGNOSIS — Z170 Estrogen receptor positive status [ER+]: Secondary | ICD-10-CM | POA: Insufficient documentation

## 2020-01-27 DIAGNOSIS — Z9013 Acquired absence of bilateral breasts and nipples: Secondary | ICD-10-CM | POA: Diagnosis not present

## 2020-01-27 DIAGNOSIS — Z853 Personal history of malignant neoplasm of breast: Secondary | ICD-10-CM | POA: Diagnosis not present

## 2020-01-27 DIAGNOSIS — Z9223 Personal history of estrogen therapy: Secondary | ICD-10-CM | POA: Insufficient documentation

## 2020-01-27 NOTE — Progress Notes (Signed)
Hematology and Oncology Follow Up Visit  Barbara Simpson [id number] [birth year] 79 y.o. 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  She called in earlier this week saying that her left implant seem to be getting larger.  She has had bilateral mastectomies back in 2014.  She has saline implants.  There is no trauma.  She says she sleeps on her left side.  There is no redness.  She has had no fever.  It is little bit tender from what she says.  She is worried about cancer coming back.  She has had no cough or shortness of breath.  She has had no nausea or vomiting.  Overall, her performance status is ECOG 1.   Medications:  Allergies as of 01/27/2020      Reactions   Cymbalta [duloxetine Hcl] Palpitations   Lidocaine-epinephrine Other (See Comments)   Patient states "nearly died"   Paroxetine Palpitations   Xylocaine [lidocaine Hcl] Other (See Comments)   Patient stated, "nearly died"   Penicillins Other (See Comments)   Headache, blood pressure drops   Sulfa Antibiotics Rash   Blotches   Risedronate Other (See Comments)   Skin growths      Medication List       Accurate as of January 27, 2020  1:52 PM. If you have any questions, ask your nurse or doctor.        STOP taking these medications   traMADol 50 MG tablet Commonly known as: ULTRAM Stopped by: Volanda Napoleon, MD     TAKE these medications   acetaminophen 500 MG tablet Commonly known as: TYLENOL Take 500 mg by mouth every 6 (six) hours as needed.   Calcium 600 600 MG Tabs tablet Generic drug: calcium carbonate Take 600 mg by mouth 2 (two) times daily with a meal.   carvedilol 12.5 MG tablet Commonly known as: COREG TAKE ONE TABLET BY MOUTH TWICE A DAY WITH  FOOD   fluticasone 50 MCG/ACT nasal spray Commonly known as: Flonase One spray in each nostril twice a day, use left hand for right nostril, and right hand for left nostril.   levothyroxine 25 MCG tablet Commonly known as: SYNTHROID Take 25 mcg by mouth daily. What changed: Another medication with the same name was removed. Continue taking this medication, and follow the directions you see here. Changed by: Volanda Napoleon, MD   magnesium oxide 400 MG tablet Commonly known as: MAG-OX Take by mouth.   Melatonin 3 MG Caps Take by mouth.   vitamin B-12 1000 MCG tablet Commonly known as: CYANOCOBALAMIN Take by mouth.   Vitamin D3 50 MCG (2000 UT) Tabs Take 50 mcg by mouth daily.       Allergies:  Allergies  Allergen Reactions  . Cymbalta [Duloxetine Hcl] Palpitations  . Lidocaine-Epinephrine Other (See Comments)    Patient states "nearly died"  . Paroxetine Palpitations  . Xylocaine [Lidocaine Hcl] Other (See Comments)    Patient stated, "nearly died"  . Penicillins Other (See Comments)    Headache, blood pressure drops  . Sulfa Antibiotics Rash    Blotches  . Risedronate Other (See Comments)    Skin growths    Past Medical History, Surgical history, Social history, and Family  History were reviewed and updated.  Review of Systems: Review of Systems  Constitutional: Negative.   HENT: Negative.   Eyes: Negative.   Respiratory: Negative.   Cardiovascular: Negative.   Gastrointestinal: Negative.   Genitourinary: Negative.   Musculoskeletal: Negative.   Skin: Negative.   Neurological: Negative.   Endo/Heme/Allergies: Negative.   Psychiatric/Behavioral: Negative.      Physical Exam:  height is 5\' 4"  (1.626 m) and weight is 160 lb 1.9 oz (72.6 kg). Her temporal temperature is 97.6 F (36.4 C). Her blood pressure is 137/60 and her pulse is 89. Her respiration is 18 and oxygen saturation is 100%.   Wt Readings from Last 3 Encounters:  01/27/20 160 lb 1.9 oz  (72.6 kg)  11/15/19 162 lb (73.5 kg)  09/15/19 162 lb 1.9 oz (73.5 kg)    Physical Exam Vitals reviewed.  Constitutional:      Comments: On her chest wall exam, she has bilateral implants.  On the right side, there is no erythema or warmth or tenderness with the implant.  There is no right axillary adenopathy.  With the left implant, there may be a little bit of swelling over on the lateral portion.  There is some tenderness to palpation along the lateral aspect of the implant.  There is no erythema.  There is no swelling.  There is no left axillary adenopathy.  HENT:     Head: Normocephalic and atraumatic.  Eyes:     Pupils: Pupils are equal, round, and reactive to light.  Cardiovascular:     Rate and Rhythm: Normal rate and regular rhythm.     Heart sounds: Normal heart sounds.  Pulmonary:     Effort: Pulmonary effort is normal.     Breath sounds: Normal breath sounds.  Abdominal:     General: Bowel sounds are normal.     Palpations: Abdomen is soft.  Musculoskeletal:        General: No tenderness or deformity. Normal range of motion.     Cervical back: Normal range of motion.  Lymphadenopathy:     Cervical: No cervical adenopathy.  Skin:    General: Skin is warm and dry.     Findings: No erythema or rash.  Neurological:     Mental Status: She is alert and oriented to person, place, and time.  Psychiatric:        Behavior: Behavior normal.        Thought Content: Thought content normal.        Judgment: Judgment normal.      Lab Results  Component Value Date   WBC 7.1 09/01/2019   HGB 13.3 09/01/2019   HCT 41.3 09/01/2019   MCV 86.4 09/01/2019   PLT 294 09/01/2019   No results found for: FERRITIN, IRON, TIBC, UIBC, IRONPCTSAT Lab Results  Component Value Date   RBC 4.78 09/01/2019   No results found for: KPAFRELGTCHN, LAMBDASER, KAPLAMBRATIO No results found for: IGGSERUM, IGA, IGMSERUM No results found for: TOTALPROTELP, ALBUMINELP, A1GS, A2GS, BETS,  BETA2SER, GAMS, MSPIKE, SPEI   Chemistry      Component Value Date/Time   NA 134 (L) 09/01/2019 1024   K 5.1 09/01/2019 1024   CL 97 (L) 09/01/2019 1024   CO2 32 09/01/2019 1024   BUN 16 09/01/2019 1024   CREATININE 0.71 09/01/2019 1024   CREATININE 0.60 12/23/2018 1114      Component Value Date/Time   CALCIUM 9.4 09/01/2019 1024   ALKPHOS 68 09/01/2019 1024   AST 11 (  L) 09/01/2019 1024   ALT 11 09/01/2019 1024   BILITOT 0.4 09/01/2019 1024       Impression and Plan: Ms. Poncedeleon is a very pleasant 79 yo caucasian female with history of left breast cancer with bilateral mastectomies in 2014 with saline implants.    She had no post op radiation or chemotherapy.  She was placed on Arimidex for 5 years and completed this in January 2020.  She had all of this surgery up in [place].  There may be a little bit of swelling over with the left implant.  I do not think there is any infection.  I said I do not see anything that looks infected.  I am not sure how we can really evaluate this.  We will try a ultrasound to see if this may help.  I told her that this really is a surgical issue.  We may have to either do general surgery.  We will try to get the ultrasound next week.  I do not see that she needs to be put on any medications right now.  I do not see any need for antibiotics.    We will keep her follow-up appointment as scheduled for right now.     Volanda Napoleon, MD 6/11/20211:52 PM

## 2020-02-08 ENCOUNTER — Ambulatory Visit
Admission: RE | Admit: 2020-02-08 | Discharge: 2020-02-08 | Disposition: A | Discharge status: Home / Self Care | Payer: Medicare Other | Source: Ambulatory Visit | Attending: Hematology & Oncology | Admitting: Hematology & Oncology

## 2020-02-08 ENCOUNTER — Other Ambulatory Visit: Payer: Self-pay

## 2020-02-08 DIAGNOSIS — Z853 Personal history of malignant neoplasm of breast: Secondary | ICD-10-CM

## 2020-02-13 ENCOUNTER — Other Ambulatory Visit: Payer: Self-pay | Admitting: Osteopathic Medicine

## 2020-02-24 DIAGNOSIS — E890 Postprocedural hypothyroidism: Secondary | ICD-10-CM | POA: Diagnosis not present

## 2020-02-24 DIAGNOSIS — C73 Malignant neoplasm of thyroid gland: Secondary | ICD-10-CM | POA: Diagnosis not present

## 2020-02-24 DIAGNOSIS — M810 Age-related osteoporosis without current pathological fracture: Secondary | ICD-10-CM | POA: Diagnosis not present

## 2020-03-14 ENCOUNTER — Encounter: Payer: Self-pay | Admitting: Osteopathic Medicine

## 2020-03-14 ENCOUNTER — Other Ambulatory Visit: Payer: Self-pay

## 2020-03-14 ENCOUNTER — Ambulatory Visit (INDEPENDENT_AMBULATORY_CARE_PROVIDER_SITE_OTHER): Payer: Medicare Other | Admitting: Osteopathic Medicine

## 2020-03-14 VITALS — BP 131/79 | HR 81 | Temp 97.0°F | Wt 159.0 lb

## 2020-03-14 DIAGNOSIS — Z0000 Encounter for general adult medical examination without abnormal findings: Secondary | ICD-10-CM | POA: Diagnosis not present

## 2020-03-14 DIAGNOSIS — M810 Age-related osteoporosis without current pathological fracture: Secondary | ICD-10-CM | POA: Diagnosis not present

## 2020-03-14 DIAGNOSIS — E890 Postprocedural hypothyroidism: Secondary | ICD-10-CM

## 2020-03-14 DIAGNOSIS — E538 Deficiency of other specified B group vitamins: Secondary | ICD-10-CM | POA: Diagnosis not present

## 2020-03-14 DIAGNOSIS — E559 Vitamin D deficiency, unspecified: Secondary | ICD-10-CM

## 2020-03-14 NOTE — Progress Notes (Signed)
HPI: Barbara Simpson is a 79 y.o. female  who presents to [place] today, 03/14/20,  for Medicare Annual Wellness Exam  Patient presents for annual physical/Medicare wellness exam. No complaints today other than chronic knee pain, following w/ sports med    Past medical, surgical, social and family history reviewed:  Patient Active Problem List   Diagnosis Date Noted  . Primary osteoarthritis of left foot 11/15/2019  . Cervicalgia 08/18/2019  . Generalized anxiety disorder 08/03/2018  . 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 History of breast cancer 08/03/2018  . Age-related osteoporosis without current pathological fracture 08/03/2018  . H/O total hysterectomy 08/03/2018  . History of primary hyperparathyroidism 07/20/2018  . History of thyroid cancer 07/20/2018    Past Surgical History:  Procedure Laterality Date  . APPENDECTOMY    . BROKEN BONE REPAIR    . CATARACT EXTRACTION, BILATERAL    . HYSTERECTOMY ABDOMINAL WITH SALPINGECTOMY    . NERVE REPAIR     LEFT ARM  . OOPHORECTOMY    . THYROID SURGERY    . TOTAL SHOULDER REPLACEMENT Right     Social History   Socioeconomic History  . Marital status: Married    Spouse name: Not on file  . Number of children: Not on file  . Years of education: Not on file  . Highest education level: Not on file  Occupational History  . Not on file  Tobacco Use  . Smoking status: Never Smoker  . Smokeless tobacco: Never Used  Vaping Use  . Vaping Use: Never used  Substance and Sexual Activity  . Alcohol use: Never  . Drug use: Never  . Sexual activity: Not Currently    Partners: Male  Other Topics Concern  . Not on file  Social History Narrative  . Not on file   Social Determinants of Health   Financial Resource Strain:   . Difficulty of Paying Living Expenses:   Food Insecurity:   . Worried About Charity fundraiser in the Last Year:   . Arboriculturist in the Last Year:    Transportation Needs:   . Film/video editor (Medical):   Marland Kitchen Lack of Transportation (Non-Medical):   Physical Activity:   . Days of Exercise per Week:   . Minutes of Exercise per Session:   Stress:   . Feeling of Stress :   Social Connections:   . Frequency of Communication with Friends and Family:   . Frequency of Social Gatherings with Friends and Family:   . Attends Religious Services:   . Active Member of Clubs or Organizations:   . Attends Archivist Meetings:   Marland Kitchen Marital Status:   Intimate Partner Violence:   . Fear of Current or Ex-Partner:   . Emotionally Abused:   Marland Kitchen Physically Abused:   . Sexually Abused:     No family history on file.   Current medication list and allergy/intolerance information reviewed:    Outpatient Encounter Medications as of 03/14/2020  Medication Sig  . acetaminophen (TYLENOL) 500 MG tablet Take 500 mg by mouth every 6 (six) hours as needed.  . calcium carbonate (CALCIUM 600) 600 MG TABS tablet Take 600 mg by mouth 2 (two) times daily with a meal.  . carvedilol (COREG) 12.5 MG tablet TAKE ONE TABLET BY MOUTH TWICE A DAY WITH FOOD  . Cholecalciferol (VITAMIN D3) 50 MCG (2000 UT) TABS Take 50 mcg by mouth daily.  . fluticasone (FLONASE) 50 MCG/ACT  nasal spray One spray in each nostril twice a day, use left hand for right nostril, and right hand for left nostril.  Marland Kitchen levothyroxine (SYNTHROID) 25 MCG tablet Take 25 mcg by mouth daily.  . magnesium oxide (MAG-OX) 400 MG tablet Take by mouth.  . Melatonin 3 MG CAPS Take by mouth.  . vitamin B-12 (CYANOCOBALAMIN) 1000 MCG tablet Take by mouth.   No facility-administered encounter medications on file as of 03/14/2020.    Allergies  Allergen Reactions  . Cymbalta [Duloxetine Hcl] Palpitations  . Lidocaine-Epinephrine Other (See Comments)    Patient states "nearly died"  . Paroxetine Palpitations  . Xylocaine [Lidocaine Hcl] Other (See Comments)    Patient stated, "nearly died"  .  Penicillins Other (See Comments)    Headache, blood pressure drops  . Sulfa Antibiotics Rash    Blotches  . Risedronate Other (See Comments)    Skin growths       Review of Systems: Review of Systems - Negative   Medicare Wellness Questionnaire  Are there smokers in your home (other than you)? no  Depression Screen (Note: if answer to either of the following is "Yes", a more complete depression screening is indicated)   Q1: Over the past two weeks, have you felt down, depressed or hopeless? no  Q2: Over the past two weeks, have you felt little interest or pleasure in doing things? no  Have you lost interest or pleasure in daily life? no  Do you often feel hopeless? no  Do you cry easily over simple problems? no  Activities of Daily Living In your present state of health, do you have any difficulty performing the following activities?:  Driving? no Managing money?  no Feeding yourself? no Getting from bed to chair? no Climbing a flight of stairs? yes Preparing food and eating?: no Bathing or showering? no Getting dressed: no Getting to the toilet? no Using the toilet: no Moving around from place to place: no In the past year have you fallen or had a near fall?: no  Hearing Difficulties:  Do you often ask people to speak up or repeat themselves? no Do you experience ringing or noises in your ears? yes  Do you have difficulty understanding soft or whispered voices? no  Memory Difficulties:  Do you feel that you have a problem with memory? no  Do you often misplace items? no  Do you feel safe at home?  yes  Sexual Health:   Are you sexually active?  Yes  Do you have more than one partner?  No  Advanced Directives:   Advanced directives discussed: has an advanced directive - a copy HAS NOT been provided.  Additional information provided: no  Risk Factors  Current exercise habits: exercises with her dog!    Dietary issues discussed:no concerns  Cardiac risk  factors: positive family history  Other practitioners:  Dr Dianah Field - sports med Dr Marin Olp - HemOnc - hx thyroid cancer, hx breast cancer Dr Hartford Poli - Endocrinology - hx thyroid cancer   Exam:  BP (!) 131/79 (BP Location: Right Arm, Patient Position: Sitting)   Pulse 81   Temp (!) 97 F (36.1 C)   Wt 159 lb (72.1 kg)   SpO2 96%   BMI 27.29 kg/m  Vision by Snellen chart: right eye:see nurse notes, left eye:see nurse notes  Constitutional: VS see above. General Appearance: alert, well-developed, well-nourished, NAD  Ears, Nose, Mouth, Throat: MMM  Neck: No masses, trachea midline.   Respiratory: Normal respiratory  effort. no wheeze, no rhonchi, no rales  Cardiovascular:No lower extremity edema.   Musculoskeletal: Gait normal. No clubbing/cyanosis of digits.   Neurological: Normal balance/coordination. No tremor. Recalls 3 objects and able to read face of watch with correct time.   Skin: warm, dry, intact. No rash/ulcer.   Psychiatric: Normal judgment/insight. Normal mood and affect. Oriented x3.     ASSESSMENT/PLAN:   Encounter for Medicare annual wellness exam  No diagnosis found.  General Preventive Care  Most recent routine screening labs: ORDERED.   Blood pressure goal 130/80 or less.   Tobacco: don't! Alcohol: responsible moderation is ok for most adults - if you have concerns about your alcohol intake, please talk to me!   Exercise: as tolerated to reduce risk of cardiovascular disease and diabetes. Strength training will also prevent osteoporosis.   Mental health: if need for mental health care (medicines, counseling, other), or concerns about moods, please let me know!   Sexual / Reproductive health: if need for STD testing, or if concerns with libido/pain problems, please let me know!   Advanced Directive: Living Will and/or Healthcare Power of Attorney recommended for all adults, regardless of age or health.  Vaccines  Flu vaccine: for almost  everyone, every fall.   Shingles vaccine: after age 55.   Pneumonia vaccines: after age 72  Tetanus booster: every 10 years   COVID vaccine: THANKS for getting your vaccine! :)  Cancer screenings   Colon cancer screening: for everyone age 54-75.   Breast cancer screening: following w/ HemOnc  Cervical cancer screening: Pap not needed   Lung cancer screening: not needed never smoker Infection screenings  . HIV: notneeded . Gonorrhea/Chlamydia: screening as needed . Hepatitis C: recommended once for everyone age 62-75 - not needed . TB: certain at-risk populations, or depending on work requirements and/or travel history Other . Bone Density Test: due         During the course of the visit the patient was educated and counseled about appropriate screening and preventive services as noted above.   Patient Instructions (the written plan) was given to the patient.  Medicare Attestation I have personally reviewed: The patient's medical and social history Their use of alcohol, tobacco or illicit drugs Their current medications and supplements The patient's functional ability including ADLs,fall risks, home safety risks, cognitive, and hearing and visual impairment Diet and physical activities Evidence for depression or mood disorders  The patient's weight, height, BMI, and visual acuity have been recorded in the chart.  I have made referrals, counseling, and provided education to the patient based on review of the above and I have provided the patient with a written personalized care plan for preventive services.     Emeterio Reeve, DO   03/14/20   Visit summary with medication list and pertinent instructions was printed for patient to review. All questions at time of visit were answered - patient instructed to contact office with any additional concerns. ER/RTC precautions were reviewed with the patient. Follow-up plan: No follow-ups on file.

## 2020-03-14 NOTE — Patient Instructions (Addendum)
Labs ordered today  Bone density testing due - ordered

## 2020-03-15 LAB — LIPID PANEL
Cholesterol: 205 mg/dL — ABNORMAL HIGH (ref <200)
HDL: 48 mg/dL — ABNORMAL LOW (ref >=50)
LDL Cholesterol (Calc): 129 mg/dL (calc) — ABNORMAL HIGH
Non-HDL Cholesterol (Calc): 157 mg/dL (calc) — ABNORMAL HIGH (ref <130)
Total CHOL/HDL Ratio: 4.3 (calc) (ref <5.0)
Triglycerides: 163 mg/dL — ABNORMAL HIGH (ref <150)

## 2020-03-15 LAB — COMPLETE METABOLIC PANEL WITH GFR
AG Ratio: 2 (calc) (ref 1.0–2.5)
ALT: 7 U/L (ref 6–29)
AST: 12 U/L (ref 10–35)
Albumin: 4.3 g/dL (ref 3.6–5.1)
Alkaline phosphatase (APISO): 72 U/L (ref 37–153)
BUN: 12 mg/dL (ref 7–25)
CO2: 31 mmol/L (ref 20–32)
Calcium: 8.9 mg/dL (ref 8.6–10.4)
Chloride: 95 mmol/L — ABNORMAL LOW (ref 98–110)
Creat: 0.65 mg/dL (ref 0.60–0.93)
GFR, Est African American: 100 mL/min/{1.73_m2} (ref >=60)
GFR, Est Non African American: 86 mL/min/{1.73_m2} (ref >=60)
Globulin: 2.2 g/dL (calc) (ref 1.9–3.7)
Glucose, Bld: 98 mg/dL (ref 65–139)
Potassium: 4.8 mmol/L (ref 3.5–5.3)
Sodium: 133 mmol/L — ABNORMAL LOW (ref 135–146)
Total Bilirubin: 0.6 mg/dL (ref 0.2–1.2)
Total Protein: 6.5 g/dL (ref 6.1–8.1)

## 2020-03-15 LAB — CBC
HCT: 41.2 % (ref 35.0–45.0)
Hemoglobin: 13.5 g/dL (ref 11.7–15.5)
MCH: 28 pg (ref 27.0–33.0)
MCHC: 32.8 g/dL (ref 32.0–36.0)
MCV: 85.5 fL (ref 80.0–100.0)
MPV: 10.2 fL (ref 7.5–12.5)
Platelets: 306 10*3/uL (ref 140–400)
RBC: 4.82 10*6/uL (ref 3.80–5.10)
RDW: 13.1 % (ref 11.0–15.0)
WBC: 7.4 10*3/uL (ref 3.8–10.8)

## 2020-03-15 LAB — VITAMIN B12: Vitamin B-12: 1908 pg/mL — ABNORMAL HIGH (ref 200–1100)

## 2020-03-15 LAB — TSH: TSH: 2.62 mIU/L (ref 0.40–4.50)

## 2020-03-28 ENCOUNTER — Other Ambulatory Visit: Payer: Self-pay

## 2020-03-28 ENCOUNTER — Ambulatory Visit (INDEPENDENT_AMBULATORY_CARE_PROVIDER_SITE_OTHER): Payer: Medicare Other

## 2020-03-28 DIAGNOSIS — M810 Age-related osteoporosis without current pathological fracture: Secondary | ICD-10-CM | POA: Diagnosis not present

## 2020-03-28 DIAGNOSIS — R29890 Loss of height: Secondary | ICD-10-CM | POA: Diagnosis not present

## 2020-03-28 DIAGNOSIS — Z780 Asymptomatic menopausal state: Secondary | ICD-10-CM | POA: Diagnosis not present

## 2020-05-15 ENCOUNTER — Other Ambulatory Visit: Payer: Self-pay | Admitting: Osteopathic Medicine

## 2020-05-31 DIAGNOSIS — Z23 Encounter for immunization: Secondary | ICD-10-CM | POA: Diagnosis not present

## 2020-07-20 ENCOUNTER — Encounter: Payer: Self-pay | Admitting: Osteopathic Medicine

## 2020-07-20 ENCOUNTER — Ambulatory Visit (INDEPENDENT_AMBULATORY_CARE_PROVIDER_SITE_OTHER): Payer: Medicare Other | Admitting: Osteopathic Medicine

## 2020-07-20 ENCOUNTER — Other Ambulatory Visit: Payer: Self-pay

## 2020-07-20 VITALS — BP 130/83 | HR 82 | Temp 98.1°F | Wt 158.0 lb

## 2020-07-20 DIAGNOSIS — E890 Postprocedural hypothyroidism: Secondary | ICD-10-CM | POA: Diagnosis not present

## 2020-07-20 DIAGNOSIS — J019 Acute sinusitis, unspecified: Secondary | ICD-10-CM | POA: Diagnosis not present

## 2020-07-20 DIAGNOSIS — R197 Diarrhea, unspecified: Secondary | ICD-10-CM

## 2020-07-20 MED ORDER — IPRATROPIUM BROMIDE 0.06 % NA SOLN: 2.0000 | Freq: Four times a day (QID) | NASAL | 1 refills | Status: DC

## 2020-07-20 MED ORDER — OMEPRAZOLE 20 MG PO TBEC: 20.0000 mg | DELAYED_RELEASE_TABLET | Freq: Every day | ORAL | 1 refills | Status: AC

## 2020-07-20 MED ORDER — OMEPRAZOLE 20 MG PO TBEC: 20.0000 mg | DELAYED_RELEASE_TABLET | Freq: Two times a day (BID) | ORAL | 1 refills | Status: DC

## 2020-07-20 NOTE — Patient Instructions (Addendum)
Plan:  Diarrhea - suspect possible IBS-D or effect of Magnesium. Will get labs and stool tests to evaluate for possible metabolic or infectious cause. STOP Magnesium. OK to continue OTC anti-diarrhea medicine as needed. If no answers on labs and symptoms persist, will refer to GI. Refilled acid reflux medication omeprazole.   Sinuses - would like to avoid antibiotics if possible. Trial Rx nasal spray Atrovent aka ipratropium along with OTC antihistamine (Claritin, Allegra, Zyrtec, anything like that in your medicine cabinet).

## 2020-07-20 NOTE — Progress Notes (Signed)
HPI: Barbara Simpson is a 79 y.o. 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  she presents to [facility] today, 07/20/20,  for chief complaint of:  Diarrhea, sinus problems   Diarrhea ongoing months, becoming more frequent, seems to be triggered by eating, doesn't matter what is eaten but soon after feels urge to go to bathroom. No bloody/black stool, no abd pain but (+)bloating.   Sinus pressure ongoing 3+ weeks. No cough, no fever.     ASSESSMENT/PLAN: The primary encounter diagnosis was Diarrhea, unspecified type. Diagnoses of Acute non-recurrent sinusitis, unspecified location and Postoperative hypothyroidism were also pertinent to this visit.  Orders Placed This Encounter  Procedures  . Stool Culture  . Ova and parasite examination  . MICROSCOPIC MESSAGE  . CBC with Differential/Platelet  . COMPLETE METABOLIC PANEL WITH GFR  . TSH  . Urinalysis, Routine w reflex microscopic  . Stool, WBC/Lactoferrin  . C. difficile GDH and Toxin A/B     Meds ordered this encounter  Medications  . ipratropium (ATROVENT) 0.06 % nasal spray    Sig: Place 2 sprays into both nostrils 4 (four) times daily. As needed for runny nose / postnasal drip    Dispense:  15 mL    Refill:  1  . DISCONTD: Omeprazole 20 MG TBEC    Sig: Take 1 tablet (20 mg total) by mouth 2 (two) times daily.    Dispense:  84 tablet    Refill:  1  . Omeprazole 20 MG TBEC    Sig: Take 1 tablet (20 mg total) by mouth daily.    Dispense:  90 tablet    Refill:  1    CANCEL PREVIOUS RX THANKS     Patient Instructions  Plan:  Diarrhea - suspect possible IBS-D or effect of Magnesium. Will get labs and stool tests to evaluate for possible metabolic or infectious cause. STOP Magnesium. OK to continue OTC anti-diarrhea medicine as needed. If no answers on labs and symptoms persist, will refer to GI. Refilled acid reflux medication omeprazole.   Sinuses - would like to avoid antibiotics if possible. Trial Rx nasal spray Atrovent aka ipratropium along with OTC antihistamine (Claritin, Allegra, Zyrtec, anything like that in your medicine cabinet).         Follow-up plan: Return for RECHECK PENDING RESULTS / IF WORSE OR CHANGE.                                                 ################################################# ################################################# ################################################# #################################################    Current Meds  Medication Sig  . acetaminophen (TYLENOL) 500 MG tablet Take 500 mg by mouth every 6 (six) hours as needed.  . calcium carbonate (CALCIUM 600) 600 MG TABS tablet Take 600 mg by mouth 2 (two) times daily with a meal.  . carvedilol (COREG) 12.5 MG tablet TAKE ONE TABLET BY MOUTH TWICE A DAY WITH FOOD  . Cholecalciferol (VITAMIN D3) 50 MCG (2000 UT) TABS Take 50 mcg by mouth daily.  . fluticasone (FLONASE) 50 MCG/ACT nasal spray One spray in each nostril twice a day, use left  hand for right nostril, and right hand for left nostril.  Marland Kitchen levothyroxine (SYNTHROID) 25 MCG tablet Take 25 mcg by mouth daily.  . magnesium oxide (MAG-OX) 400 MG tablet Take by mouth.  . Melatonin 3 MG CAPS Take by mouth.  . vitamin B-12 (CYANOCOBALAMIN) 1000 MCG tablet Take by mouth.   Immunization History  Administered Date(s) Administered  . Fluad Quad(high Dose 65+) 05/04/2019  . Influenza, High Dose Seasonal PF 06/15/2018  . Influenza-Unspecified 07/07/2018, 07/04/2020  .  PFIZER SARS-COV-2 Vaccination 10/09/2019, 11/02/2019  . Tdap 08/18/2010    Allergies  Allergen Reactions  . Cymbalta [Duloxetine Hcl] Palpitations  . Lidocaine Hcl Other (See Comments)    Patient stated, "nearly died" Patient stated, "nearly died"  . Lidocaine-Epinephrine Other (See Comments)    Patient states "nearly died"  . Paroxetine Palpitations  . Penicillins Other (See Comments)    Headache, blood pressure drops  . Risedronate Other (See Comments)    Skin growths Skin growths  . Sulfa Antibiotics Rash and Other (See Comments)    Blotches  . Other Other (See Comments)    Uncoded Allergy. Allergen: Xylocaine with epinephrine       Review of Systems: Pertinent (+) and (-) ROS in HPI as above   Exam:  BP 130/83 (BP Location: Left Arm, Patient Position: Sitting, Cuff Size: Normal)   Pulse 82   Temp 98.1 F (36.7 C) (Oral)   Wt 158 lb (71.7 kg)   BMI 27.12 kg/m   Constitutional: VS see above. General Appearance: alert, well-developed, well-nourished, NAD  Neck: No masses, trachea midline.   Respiratory: Normal respiratory effort. no wheeze, no rhonchi, no rales  Cardiovascular: S1/S2 normal, no murmur, no rub/gallop auscultated. RRR.   Musculoskeletal: Gait normal. Symmetric and independent movement of all extremities  Abdominal: non-tender, non-distended, no appreciable organomegaly, neg Murphy's, BS WNLx4  Neurological: Normal balance/coordination. No tremor.  Skin: warm, dry, intact.   Psychiatric: Normal judgment/insight. Normal mood and affect. Oriented x3.       Visit summary with medication list and pertinent instructions was printed for patient to review, patient was advised to alert [place] if any updates are needed. All questions at time of visit were answered - patient instructed to contact office with any additional concerns. ER/RTC precautions were reviewed with the patient and understanding verbalized.       Please note: voice recognition  software was used to produce this document, and typos may escape review. Please contact Dr. Sheppard Coil for any needed clarifications.    Follow up plan: Return for RECHECK PENDING RESULTS / IF WORSE OR CHANGE.

## 2020-07-21 LAB — CBC WITH DIFFERENTIAL/PLATELET
Absolute Monocytes: 506 cells/uL (ref 200–950)
Basophils Absolute: 32 cells/uL (ref 0–200)
Basophils Relative: 0.5 %
Eosinophils Absolute: 166 cells/uL (ref 15–500)
Eosinophils Relative: 2.6 %
HCT: 39.9 % (ref 35.0–45.0)
Hemoglobin: 13 g/dL (ref 11.7–15.5)
Lymphs Abs: 1376 cells/uL (ref 850–3900)
MCH: 26.7 pg — ABNORMAL LOW (ref 27.0–33.0)
MCHC: 32.6 g/dL (ref 32.0–36.0)
MCV: 81.9 fL (ref 80.0–100.0)
MPV: 10.3 fL (ref 7.5–12.5)
Monocytes Relative: 7.9 %
Neutro Abs: 4320 cells/uL (ref 1500–7800)
Neutrophils Relative %: 67.5 %
Platelets: 301 10*3/uL (ref 140–400)
RBC: 4.87 10*6/uL (ref 3.80–5.10)
RDW: 13.6 % (ref 11.0–15.0)
Total Lymphocyte: 21.5 %
WBC: 6.4 10*3/uL (ref 3.8–10.8)

## 2020-07-21 LAB — COMPLETE METABOLIC PANEL WITH GFR
AG Ratio: 1.8 (calc) (ref 1.0–2.5)
ALT: 8 U/L (ref 6–29)
AST: 10 U/L (ref 10–35)
Albumin: 4.1 g/dL (ref 3.6–5.1)
Alkaline phosphatase (APISO): 76 U/L (ref 37–153)
BUN/Creatinine Ratio: 16 (calc) (ref 6–22)
BUN: 9 mg/dL (ref 7–25)
CO2: 29 mmol/L (ref 20–32)
Calcium: 9.2 mg/dL (ref 8.6–10.4)
Chloride: 96 mmol/L — ABNORMAL LOW (ref 98–110)
Creat: 0.57 mg/dL — ABNORMAL LOW (ref 0.60–0.93)
GFR, Est African American: 104 mL/min/{1.73_m2} (ref >=60)
GFR, Est Non African American: 89 mL/min/{1.73_m2} (ref >=60)
Globulin: 2.3 g/dL (calc) (ref 1.9–3.7)
Glucose, Bld: 94 mg/dL (ref 65–99)
Potassium: 4.5 mmol/L (ref 3.5–5.3)
Sodium: 134 mmol/L — ABNORMAL LOW (ref 135–146)
Total Bilirubin: 0.4 mg/dL (ref 0.2–1.2)
Total Protein: 6.4 g/dL (ref 6.1–8.1)

## 2020-07-21 LAB — URINALYSIS, ROUTINE W REFLEX MICROSCOPIC
Bacteria, UA: NONE SEEN /HPF
Bilirubin Urine: NEGATIVE
Glucose, UA: NEGATIVE
Hgb urine dipstick: NEGATIVE
Hyaline Cast: NONE SEEN /LPF
Ketones, ur: NEGATIVE
Nitrite: NEGATIVE
Protein, ur: NEGATIVE
RBC / HPF: NONE SEEN /HPF (ref 0–2)
Specific Gravity, Urine: 1.004 (ref 1.001–1.03)
pH: 7.5 (ref 5.0–8.0)

## 2020-07-21 LAB — TSH: TSH: 2.55 mIU/L (ref 0.40–4.50)

## 2020-07-31 ENCOUNTER — Telehealth: Payer: Self-pay

## 2020-07-31 NOTE — Telephone Encounter (Signed)
Returned pts call to r/s her appt with sarah to 09/2020.... AOM

## 2020-08-13 ENCOUNTER — Telehealth: Payer: Self-pay | Admitting: Osteopathic Medicine

## 2020-08-13 NOTE — Telephone Encounter (Signed)
Has never seen Dr Linford Arnold for anything else and she is not taking new patients so I'd say no to change unless Dr Judie Petit knows something I dont!

## 2020-08-13 NOTE — Telephone Encounter (Signed)
Patient called and was wondering if she could switch her care to Dr.Metheney from Dr Lyn Hollingshead. Patient states she feels like there is not a good connection between her and Dr Lyn Hollingshead and would prefer to see Dr Linford Arnold. IF you will let me know, I will call patient. Thank you

## 2020-08-13 NOTE — Telephone Encounter (Signed)
I agree. I am not taking new patients. OK to see Barbara Simpson.

## 2020-08-14 DIAGNOSIS — Z23 Encounter for immunization: Secondary | ICD-10-CM | POA: Diagnosis not present

## 2020-08-14 NOTE — Telephone Encounter (Signed)
Patient states she seen Dr. Linford Arnold a couple times before in the past and really liked the way she is with her patients so she does not want to switch to [employer]

## 2020-08-14 NOTE — Telephone Encounter (Signed)
I will call patient and offer Joy

## 2020-08-23 ENCOUNTER — Other Ambulatory Visit: Payer: Self-pay | Admitting: Osteopathic Medicine

## 2020-08-23 DIAGNOSIS — E890 Postprocedural hypothyroidism: Secondary | ICD-10-CM | POA: Diagnosis not present

## 2020-08-23 DIAGNOSIS — R197 Diarrhea, unspecified: Secondary | ICD-10-CM | POA: Diagnosis not present

## 2020-08-23 DIAGNOSIS — J019 Acute sinusitis, unspecified: Secondary | ICD-10-CM | POA: Diagnosis not present

## 2020-08-28 LAB — SALMONELLA/SHIGELLA CULT, CAMPY EIA AND SHIGA TOXIN RFL ECOLI
MICRO NUMBER: 11391726
MICRO NUMBER:: 11391727
MICRO NUMBER:: 11391728
Result:: NOT DETECTED
SHIGA RESULT:: NOT DETECTED
SPECIMEN QUALITY: ADEQUATE
SPECIMEN QUALITY:: ADEQUATE
SPECIMEN QUALITY:: ADEQUATE

## 2020-08-28 LAB — OVA AND PARASITE EXAMINATION
CONCENTRATE RESULT:: NONE SEEN
MICRO NUMBER:: 11390572
SPECIMEN QUALITY:: ADEQUATE
TRICHROME RESULT:: NONE SEEN

## 2020-08-30 DIAGNOSIS — Z961 Presence of intraocular lens: Secondary | ICD-10-CM | POA: Diagnosis not present

## 2020-08-30 DIAGNOSIS — H353131 Nonexudative age-related macular degeneration, bilateral, early dry stage: Secondary | ICD-10-CM | POA: Diagnosis not present

## 2020-08-30 DIAGNOSIS — H43813 Vitreous degeneration, bilateral: Secondary | ICD-10-CM | POA: Diagnosis not present

## 2020-08-30 DIAGNOSIS — H527 Unspecified disorder of refraction: Secondary | ICD-10-CM | POA: Diagnosis not present

## 2020-08-30 DIAGNOSIS — H26493 Other secondary cataract, bilateral: Secondary | ICD-10-CM | POA: Diagnosis not present

## 2020-08-31 ENCOUNTER — Other Ambulatory Visit: Payer: Medicare Other

## 2020-08-31 ENCOUNTER — Encounter: Payer: Medicare Other | Admitting: Family

## 2020-09-13 DIAGNOSIS — H26491 Other secondary cataract, right eye: Secondary | ICD-10-CM | POA: Diagnosis not present

## 2020-09-25 ENCOUNTER — Other Ambulatory Visit: Payer: Self-pay | Admitting: Family

## 2020-09-25 DIAGNOSIS — Z853 Personal history of malignant neoplasm of breast: Secondary | ICD-10-CM

## 2020-09-26 ENCOUNTER — Encounter: Payer: Self-pay | Admitting: Family

## 2020-09-26 ENCOUNTER — Inpatient Hospital Stay: Payer: Medicare Other | Admitting: Family

## 2020-09-26 ENCOUNTER — Inpatient Hospital Stay: Payer: Medicare Other | Attending: Family

## 2020-09-26 ENCOUNTER — Telehealth: Payer: Self-pay

## 2020-09-26 ENCOUNTER — Other Ambulatory Visit: Payer: Self-pay

## 2020-09-26 DIAGNOSIS — Z9223 Personal history of estrogen therapy: Secondary | ICD-10-CM | POA: Diagnosis not present

## 2020-09-26 DIAGNOSIS — Z9013 Acquired absence of bilateral breasts and nipples: Secondary | ICD-10-CM | POA: Insufficient documentation

## 2020-09-26 DIAGNOSIS — Z170 Estrogen receptor positive status [ER+]: Secondary | ICD-10-CM | POA: Diagnosis not present

## 2020-09-26 DIAGNOSIS — Z853 Personal history of malignant neoplasm of breast: Secondary | ICD-10-CM | POA: Diagnosis present

## 2020-09-26 DIAGNOSIS — C50912 Malignant neoplasm of unspecified site of left female breast: Secondary | ICD-10-CM | POA: Diagnosis not present

## 2020-09-26 LAB — CMP (CANCER CENTER ONLY)
ALT: 7 U/L (ref 0–44)
AST: 10 U/L — ABNORMAL LOW (ref 15–41)
Albumin: 4.3 g/dL (ref 3.5–5.0)
Alkaline Phosphatase: 69 U/L (ref 38–126)
Anion gap: 6 (ref 5–15)
BUN: 10 mg/dL (ref 8–23)
CO2: 32 mmol/L (ref 22–32)
Calcium: 9.5 mg/dL (ref 8.9–10.3)
Chloride: 95 mmol/L — ABNORMAL LOW (ref 98–111)
Creatinine: 0.65 mg/dL (ref 0.44–1.00)
GFR, Estimated: >60 mL/min (ref >60)
Glucose, Bld: 92 mg/dL (ref 70–99)
Potassium: 4.6 mmol/L (ref 3.5–5.1)
Sodium: 133 mmol/L — ABNORMAL LOW (ref 135–145)
Total Bilirubin: 0.5 mg/dL (ref 0.3–1.2)
Total Protein: 6.5 g/dL (ref 6.5–8.1)

## 2020-09-26 LAB — CBC WITH DIFFERENTIAL (CANCER CENTER ONLY)
Abs Immature Granulocytes: 0.04 10*3/uL (ref 0.00–0.07)
Basophils Absolute: 0.1 10*3/uL (ref 0.0–0.1)
Basophils Relative: 1 %
Eosinophils Absolute: 0.2 10*3/uL (ref 0.0–0.5)
Eosinophils Relative: 2 %
HCT: 38.9 % (ref 36.0–46.0)
Hemoglobin: 12.7 g/dL (ref 12.0–15.0)
Immature Granulocytes: 1 %
Lymphocytes Relative: 21 %
Lymphs Abs: 1.5 10*3/uL (ref 0.7–4.0)
MCH: 27.4 pg (ref 26.0–34.0)
MCHC: 32.6 g/dL (ref 30.0–36.0)
MCV: 84 fL (ref 80.0–100.0)
Monocytes Absolute: 0.7 10*3/uL (ref 0.1–1.0)
Monocytes Relative: 9 %
Neutro Abs: 5 10*3/uL (ref 1.7–7.7)
Neutrophils Relative %: 66 %
Platelet Count: 285 10*3/uL (ref 150–400)
RBC: 4.63 MIL/uL (ref 3.87–5.11)
RDW: 15.1 % (ref 11.5–15.5)
WBC Count: 7.4 10*3/uL (ref 4.0–10.5)
nRBC: 0 % (ref 0.0–0.2)

## 2020-09-26 NOTE — Telephone Encounter (Signed)
S/w pt and she is aware of her one year appt per 09/26/20 los   Barbara Simpson

## 2020-09-26 NOTE — Progress Notes (Addendum)
Hematology and Oncology Follow Up Visit  Barbara Simpson [id number] November 22, 1940 80 y.o. 09/26/2020   Principle Diagnosis:  Early stage breast cancer of the left breast-stage not known - s/p bilateral mastectomies in 2014  Past Therapy: Arimidex 5 years completed January 2020   Current Therapy:        Observation    Interim History:  Barbara Simpson is here today for survivorship follow-up. She is doing well and has no complaints at this time.  No adenopathy noted on exam.  Breast exam showed bilateral mastectomies with reconstruction intact. No mass, lesion or rash noted.  She denies lymphedema.  She would like to try somewhere new for bra fitting. She did not care for the fitting or bras that she got from [facility].  No fever, chills, n/v, cough, rash, dizziness, SOB, chest pain, palpitations, abdominal pain or changes in bowel or bladder habits. No blood loss, no bruising or petechiae to report.  No swelling, tenderness, numbness or tingling in her extremities.  No falls or syncope.  She has maintained a good appetite and is staying well hydrated. Her weight is stable.    ECOG Performance Status: 1 - Symptomatic but completely ambulatory  Medications:  Allergies as of 09/26/2020      Reactions   Cymbalta [duloxetine Hcl] Palpitations   Lidocaine Hcl Other (See Comments)   Patient stated, "nearly died" Patient stated, "nearly died"   Lidocaine-epinephrine Other (See Comments)   Patient states "nearly died"   Paroxetine Palpitations   Penicillins Other (See Comments)   Headache, blood pressure drops   Risedronate Other (See Comments)   Skin growths Skin growths   Sulfa Antibiotics Rash, Other (See Comments)   Blotches   Other Other (See Comments)   Uncoded Allergy. Allergen: Xylocaine with epinephrine      Medication List       Accurate as of September 26, 2020 11:13 AM. If you have any questions, ask your nurse or doctor.        acetaminophen 500 MG  tablet Commonly known as: TYLENOL Take 500 mg by mouth every 6 (six) hours as needed.   Calcium 600 600 MG Tabs tablet Generic drug: calcium carbonate Take 600 mg by mouth 2 (two) times daily with a meal.   carvedilol 12.5 MG tablet Commonly known as: COREG TAKE ONE TABLET BY MOUTH TWICE A DAY WITH FOOD   fluticasone 50 MCG/ACT nasal spray Commonly known as: Flonase One spray in each nostril twice a day, use left hand for right nostril, and right hand for left nostril.   ipratropium 0.06 % nasal spray Commonly known as: ATROVENT Place 2 sprays into both nostrils 4 (four) times daily. As needed for runny nose / postnasal drip   levothyroxine 25 MCG tablet Commonly known as: SYNTHROID Take 25 mcg by mouth daily.   magnesium oxide 400 MG tablet Commonly known as: MAG-OX Take by mouth.   Melatonin 3 MG Caps Take by mouth.   Omeprazole 20 MG Tbec Take 1 tablet (20 mg total) by mouth daily.   vitamin B-12 1000 MCG tablet Commonly known as: CYANOCOBALAMIN Take by mouth.   Vitamin D3 50 MCG (2000 UT) Tabs Take 50 mcg by mouth daily.       Allergies:  Allergies  Allergen Reactions  . Cymbalta [Duloxetine Hcl] Palpitations  . Lidocaine Hcl Other (See Comments)    Patient stated, "nearly died" Patient stated, "nearly died"  . Lidocaine-Epinephrine Other (See Comments)    Patient states "  nearly died"  . Paroxetine Palpitations  . Penicillins Other (See Comments)    Headache, blood pressure drops  . Risedronate Other (See Comments)    Skin growths Skin growths  . Sulfa Antibiotics Rash and Other (See Comments)    Blotches  . Other Other (See Comments)    Uncoded Allergy. Allergen: Xylocaine with epinephrine    Past Medical History, Surgical history, Social history, and Family History were reviewed and updated.  Review of Systems: All other 10 point review of systems is negative.   Physical Exam:  vitals were not taken for this visit.   Wt Readings from Last  3 Encounters:  07/20/20 158 lb (71.7 kg)  03/14/20 159 lb (72.1 kg)  01/27/20 160 lb 1.9 oz (72.6 kg)    Ocular: Sclerae unicteric, pupils equal, round and reactive to light Ear-nose-throat: Oropharynx clear, dentition fair Lymphatic: No cervical or supraclavicular adenopathy Lungs no rales or rhonchi, good excursion bilaterally Heart regular rate and rhythm, no murmur appreciated Abd soft, nontender, positive bowel sounds MSK no focal spinal tenderness, no joint edema Neuro: non-focal, well-oriented, appropriate affect Breasts: Breast exam showed bilateral mastectomies with reconstruction intact. No mass, lesion or rash noted.   Lab Results  Component Value Date   WBC 7.4 09/26/2020   HGB 12.7 09/26/2020   HCT 38.9 09/26/2020   MCV 84.0 09/26/2020   PLT 285 09/26/2020   No results found for: FERRITIN, IRON, TIBC, UIBC, IRONPCTSAT Lab Results  Component Value Date   RBC 4.63 09/26/2020   No results found for: KPAFRELGTCHN, LAMBDASER, KAPLAMBRATIO No results found for: IGGSERUM, IGA, IGMSERUM No results found for: TOTALPROTELP, ALBUMINELP, A1GS, A2GS, BETS, BETA2SER, GAMS, MSPIKE, SPEI   Chemistry      Component Value Date/Time   NA 134 (L) 07/20/2020 0000   K 4.5 07/20/2020 0000   CL 96 (L) 07/20/2020 0000   CO2 29 07/20/2020 0000   BUN 9 07/20/2020 0000   CREATININE 0.57 (L) 07/20/2020 0000      Component Value Date/Time   CALCIUM 9.2 07/20/2020 0000   ALKPHOS 68 09/01/2019 1024   AST 10 07/20/2020 0000   AST 11 (L) 09/01/2019 1024   ALT 8 07/20/2020 0000   ALT 11 09/01/2019 1024   BILITOT 0.4 07/20/2020 0000   BILITOT 0.4 09/01/2019 1024       Impression and Plan: Barbara Simpson is a very pleasant 80 yo caucasian female with history of left breast cancer with bilateral mastectomies in 2014 with saline implants.   She completed 5 years of Arimidex in January 2020.  She continues to do well and has no complaints at this time.  Follow-up in 1 year.  She can  contact our office with any questions or concerns.   Laverna Peace, [place] 2/9/202211:13 AM   Addendum: [facility] - prescription for bras faxed.           Phone: [phone]          Fax: [phone]

## 2020-09-27 LAB — CANCER ANTIGEN 27.29: CA 27.29: 20.6 U/mL (ref 0.0–38.6)

## 2020-10-05 NOTE — Addendum Note (Signed)
Addended by: Eliezer Bottom on: 10/05/2020 10:45 AM   Modules accepted: Orders

## 2020-11-02 ENCOUNTER — Telehealth: Payer: Self-pay | Admitting: Family

## 2020-11-02 NOTE — Telephone Encounter (Signed)
Appointments rescheduled due to provider pal.  Updated calendar & letter was mailed

## 2020-11-05 ENCOUNTER — Telehealth: Payer: Self-pay

## 2020-11-05 DIAGNOSIS — M25579 Pain in unspecified ankle and joints of unspecified foot: Secondary | ICD-10-CM

## 2020-11-05 NOTE — Telephone Encounter (Signed)
Barbara Simpson complains of ongoing left ankle pain. She would like to be referred to a podiatrist. Please advise.

## 2020-11-09 ENCOUNTER — Ambulatory Visit: Payer: Medicare Other | Admitting: Podiatry

## 2020-11-09 ENCOUNTER — Other Ambulatory Visit: Payer: Self-pay

## 2020-11-09 DIAGNOSIS — Q666 Other congenital valgus deformities of feet: Secondary | ICD-10-CM

## 2020-11-09 DIAGNOSIS — R609 Edema, unspecified: Secondary | ICD-10-CM | POA: Diagnosis not present

## 2020-11-09 DIAGNOSIS — M779 Enthesopathy, unspecified: Secondary | ICD-10-CM

## 2020-11-09 DIAGNOSIS — M79672 Pain in left foot: Secondary | ICD-10-CM | POA: Diagnosis not present

## 2020-11-13 NOTE — Progress Notes (Signed)
Subjective:   Patient ID: Barbara Simpson, female   DOB: 80 y.o.   MRN: [id number]   HPI 80 year old female presents the office today for evaluation of left foot chronic swelling, discomfort on the lateral aspect mostly of the left foot.  She states that she will get swelling and pain to the area but denies any recent injury or trauma.  She previously had an MRI performed as well as x-rays and she was told she had arthritis.  She said at times the area can become very painful.  Review of Systems  All other systems reviewed and are negative.  Past Medical History:  Diagnosis Date  . Age-related osteoporosis without current pathological fracture 08/03/2018  . Anxiety   . Breast cancer ([place])   . Generalized anxiety disorder 08/03/2018  . H/O total hysterectomy 08/03/2018  . High blood pressure   . History of breast cancer 08/03/2018  . History of primary hyperparathyroidism 07/20/2018  . Hypothyroidism   . 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 Thyroid cancer ([place]) 07/20/2018    Past Surgical History:  Procedure Laterality Date  . APPENDECTOMY    . BROKEN BONE REPAIR    . CATARACT EXTRACTION, BILATERAL    . HYSTERECTOMY ABDOMINAL WITH SALPINGECTOMY    . NERVE REPAIR     LEFT ARM  . OOPHORECTOMY    . THYROID SURGERY    . TOTAL SHOULDER REPLACEMENT Right      Current Outpatient Medications:  .  acetaminophen (TYLENOL) 500 MG tablet, Take 500 mg by mouth every 6 (six) hours as needed., Disp: , Rfl:  .  calcium carbonate (OS-CAL) 600 MG TABS tablet, Take 600 mg by mouth 2 (two) times daily with a meal., Disp: , Rfl:  .  carvedilol (COREG) 12.5 MG tablet, TAKE ONE TABLET BY MOUTH TWICE A DAY WITH FOOD, Disp: 180 tablet, Rfl: 3 .  Cholecalciferol (VITAMIN D3) 50 MCG (2000 UT) TABS, Take 50 mcg by mouth daily., Disp: 90 tablet, Rfl: 3 .  docusate sodium (COLACE) 100 MG capsule, Take 300 mg by mouth at bedtime., Disp: , Rfl:  .  fluticasone (FLONASE) 50 MCG/ACT nasal spray, One  spray in each nostril twice a day, use left hand for right nostril, and right hand for left nostril., Disp: 48 g, Rfl: 3 .  levothyroxine (SYNTHROID) 25 MCG tablet, Take 25 mcg by mouth daily., Disp: , Rfl:  .  magnesium oxide (MAG-OX) 400 MG tablet, Take by mouth., Disp: , Rfl:  .  Melatonin 3 MG CAPS, Take by mouth., Disp: , Rfl:  .  Omeprazole 20 MG TBEC, Take 1 tablet (20 mg total) by mouth daily., Disp: 90 tablet, Rfl: 1 .  psyllium (METAMUCIL) 58.6 % powder, Take 1 packet by mouth daily., Disp: , Rfl:  .  vitamin B-12 (CYANOCOBALAMIN) 1000 MCG tablet, Take by mouth., Disp: , Rfl:   Allergies  Allergen Reactions  . Cymbalta [Duloxetine Hcl] Palpitations  . Lidocaine Hcl Other (See Comments)    Patient stated, "nearly died"   . Lidocaine-Epinephrine Other (See Comments)    Patient states "nearly died"  . Paroxetine Palpitations  . Penicillins Other (See Comments)    Headache, blood pressure drops  . Risedronate Other (See Comments)    Skin growths   . Sulfa Antibiotics Rash and Other (See Comments)  . Other Other (See Comments)    Uncoded Allergy. Allergen: Xylocaine with epinephrine        Objective:  Physical Exam  General: AAO x3, NAD  Dermatological: Skin is warm, dry and supple bilateral.  There are no open sores, no preulcerative lesions, no rash or signs of infection present.  Vascular: Dorsalis Pedis artery and Posterior Tibial artery pedal pulses are 2/4 bilateral with immedate capillary fill time. There is no pain with calf compression, swelling, warmth, erythema.   Neruologic: Grossly intact via light touch bilateral.   Musculoskeletal: There is decreased medial arch upon weightbearing during gait evaluation she does overpronate.  There is mild tenderness palpation on the lateral aspect foot mostly on the sinus tarsi.  Mild discomfort along the course the peroneal tendon but overall the tendons appear to be intact.  No pain in the ankle joint itself.  No pain  with ankle joint range of motion.  No area of pinpoint tenderness.  MMT 5/5.  There is mild edema present to lateral aspect of the ankle mostly localized along the sinus tarsi.  Gait: Unassisted, Nonantalgic.       Assessment:   38-year female capsulitis, pronation     Plan:  -Treatment options discussed including all alternatives, risks, and complications -Etiology of symptoms were discussed -Reviewed the previous x-rays as well as the MRI. 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  We discussed steroid injection but she was to hold off on this today as she had this previously only lasted for short time.  I measured today for orthotics and we provided better arch support will be helpful.  Discussed shoe modifications.  Trula Slade DPM

## 2020-11-20 ENCOUNTER — Other Ambulatory Visit: Payer: Self-pay | Admitting: Family Medicine

## 2020-11-20 ENCOUNTER — Telehealth: Payer: Self-pay

## 2020-11-20 ENCOUNTER — Ambulatory Visit: Payer: Medicare Other

## 2020-11-20 ENCOUNTER — Other Ambulatory Visit: Payer: Self-pay

## 2020-11-20 ENCOUNTER — Encounter: Payer: Self-pay | Admitting: Family Medicine

## 2020-11-20 ENCOUNTER — Ambulatory Visit (INDEPENDENT_AMBULATORY_CARE_PROVIDER_SITE_OTHER): Payer: Medicare Other | Admitting: Family Medicine

## 2020-11-20 VITALS — BP 110/73 | HR 72 | Wt 157.0 lb

## 2020-11-20 DIAGNOSIS — R002 Palpitations: Secondary | ICD-10-CM

## 2020-11-20 DIAGNOSIS — R0789 Other chest pain: Secondary | ICD-10-CM

## 2020-11-20 NOTE — Patient Instructions (Signed)
Premature Ventricular Contraction  A premature ventricular contraction (PVC) is a common kind of irregular heartbeat (arrhythmia). These contractions are extra heartbeats that start in the ventricles of the heart and occur too early in the normal sequence. During the PVC, the heart's normal electrical pathway is not used, so the beat is shorter and less effective. In most cases, these contractions come and go and do not require treatment. What are the causes? Common causes of the condition include:  Smoking.  Drinking alcohol.  Certain medicines.  Some illegal drugs.  Stress.  Caffeine. Certain medical conditions can also cause PVCs:  Heart failure.  Heart attack, or coronary artery disease.  Heart valve problems.  Changes in minerals in the blood (electrolytes).  Low blood oxygen levels or high carbon dioxide levels. In many cases, the cause of this condition is not known. What are the signs or symptoms? The main symptom of this condition is fast or skipped heartbeats (palpitations). Other symptoms include:  Chest pain.  Shortness of breath.  Feeling tired.  Dizziness.  Difficulty exercising. In some cases, there are no symptoms. How is this diagnosed? This condition may be diagnosed based on:  Your medical history.  A physical exam. During the exam, the health care provider will check for irregular heartbeats.  Tests, such as: ? An ECG (electrocardiogram) to monitor the electrical activity of your heart. ? An ambulatory cardiac monitor. This device records your heartbeats for 24 hours or more. ? Stress tests to see how exercise affects your heart rhythm and blood supply. ? An echocardiogram. This test uses sound waves (ultrasound) to produce an image of your heart. ? An electrophysiology study (EPS). This test checks for electrical problems in your heart. How is this treated? Treatment for this condition depends on any underlying conditions, the type of PVCs  that you are having, and how much the symptoms are interfering with your daily life. Possible treatments include:  Avoiding things that cause premature contractions (triggers). These include caffeine and alcohol.  Taking medicines if symptoms are severe or if the extra heartbeats are frequent.  Getting treatment for underlying conditions that cause PVCs.  Having an implantable cardioverter defibrillator (ICD), if you are at risk for a serious arrhythmia. The ICD is a small device that is inserted into your chest to monitor your heartbeat. When it senses an irregular heartbeat, it sends a shock to bring the heartbeat back to normal.  Having a procedure to destroy the portion of the heart tissue that sends out abnormal signals (catheter ablation). In some cases, no treatment is required. Follow these instructions at home: Lifestyle  Do not use any products that contain nicotine or tobacco, such as cigarettes, e-cigarettes, and chewing tobacco. If you need help quitting, ask your health care provider.  Do not use illegal drugs.  Exercise regularly. Ask your health care provider what type of exercise is safe for you.  Try to get at least 7-9 hours of sleep each night, or as much as recommended by your health care provider.  Find healthy ways to manage stress. Avoid stressful situations when possible. Alcohol use  Do not drink alcohol if: ? Your health care provider tells you not to drink. ? You are pregnant, may be pregnant, or are planning to become pregnant. ? Alcohol triggers your episodes.  If you drink alcohol: ? Limit how much you use to:  0-1 drink a day for women.  0-2 drinks a day for men.  Be aware of how much   alcohol is in your drink. In the [place] one drink equals one 12 oz bottle of beer (355 mL), one 5 oz glass of wine (148 mL), or one 1 oz glass of hard liquor (44 mL). General instructions  Take over-the-counter and prescription medicines only as told by your  health care provider.  If caffeine triggers episodes of PVC, do not eat, drink, or use anything with caffeine in it.  Keep all follow-up visits as told by your health care provider. This is important. Contact a health care provider if you:  Feel palpitations. Get help right away if you:  Have chest pain.  Have shortness of breath.  Have sweating for no reason.  Have nausea and vomiting.  Become light-headed or you faint. Summary  A premature ventricular contraction (PVC) is a common kind of irregular heartbeat (arrhythmia).  In most cases, these contractions come and go and do not require treatment.  You may need to wear an ambulatory cardiac monitor. This records your heartbeats for 24 hours or more.  Treatment depends on any underlying conditions, the type of PVCs that you are having, and how much the symptoms are interfering with your daily life. This information is not intended to replace advice given to you by your health care provider. Make sure you discuss any questions you have with your health care provider. Document Revised: 04/29/2018 Document Reviewed: 04/29/2018 Elsevier Patient Education  2021 [employer].

## 2020-11-20 NOTE — Telephone Encounter (Signed)
Pt called and stated that for the past two weeks her heart rate feels fast. She is not sure if this has anything to due with her current medications. Scheduled same day visit for 2pm with Caleen Jobs.

## 2020-11-20 NOTE — Progress Notes (Signed)
Acute Office Visit  Subjective:    Patient ID: Barbara Simpson, female    DOB: 1941-04-13, 80 y.o.   MRN: [id number]  Chief Complaint  Patient presents with  . Palpitations    HPI Patient is in today for chest tightness and palpitations.  Patient with about 2.5 weeks of chest tightness and intermittent palpitations. She reports she saw her endocrinologist in February and they increased her thyroid medication by one extra tablet per week. At her recheck in March her levels had normalized. However, shortly after that, she noticed some chest tightness and palpitations.  Chest tightness is constant and does not change with inspiration or physical activity. She says there is 0/10 pain or pressure, just the tightness which is located over her sternum. The tightness does get a little bit worse and can be associated with some shortness of breath when she feels anxious. Reports she has been more anxious lately with her own health issues, appointments, procedures, etc and her husbands health and decline in hearing. Tightness does not radiate or cause any pain to her arm/shoulder or jaw, no difficulty breathing, dizziness, fevers, GI symptoms, vision changes. The discomfort does not interfere with her daily activities.  Palpitations occur about once every other day or so and are described as heart beating too fast for a few seconds before normalizing. See below.  PALPITATIONS Duration: weeks Symptom description: chest tightness constant/ongoing; palpitations feel fast for a few seconds then normalizes, makes her anxious Duration of episode: seconds Frequency: no history of the same ; palpitations once every day or so Activity when event occurred: no correlation Related to exertion: no Dyspnea: occasionally when she is anxious Chest pain: no, just constant tightness Syncope: no Anxiety/stress: yes Nausea/vomiting: no Diaphoresis: no Coronary artery disease: no Congestive heart failure:  no Arrhythmia:no Thyroid disease: yes Caffeine intake: Heavy caffeine consumption ; 3 servings/day Status:  stable Treatments attempted:none    Past Medical History:  Diagnosis Date  . Age-related osteoporosis without current pathological fracture 08/03/2018  . Anxiety   . Breast cancer ([place])   . Generalized anxiety disorder 08/03/2018  . H/O total hysterectomy 08/03/2018  . High blood pressure   . History of breast cancer 08/03/2018  . History of primary hyperparathyroidism 07/20/2018  . Hypothyroidism   . 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 Thyroid cancer ([place]) 07/20/2018    Past Surgical History:  Procedure Laterality Date  . APPENDECTOMY    . BROKEN BONE REPAIR    . CATARACT EXTRACTION, BILATERAL    . HYSTERECTOMY ABDOMINAL WITH SALPINGECTOMY    . NERVE REPAIR     LEFT ARM  . OOPHORECTOMY    . THYROID SURGERY    . TOTAL SHOULDER REPLACEMENT Right     History reviewed. No pertinent family history.  Social History   Socioeconomic History  . Marital status: Married    Spouse name: Not on file  . Number of children: Not on file  . Years of education: Not on file  . Highest education level: Not on file  Occupational History  . Not on file  Tobacco Use  . Smoking status: Never Smoker  . Smokeless tobacco: Never Used  Vaping Use  . Vaping Use: Never used  Substance and Sexual Activity  . Alcohol use: Never  . Drug use: Never  . Sexual activity: Not Currently    Partners: Male  Other Topics Concern  . Not on file  Social History Narrative  . Not on file   Social Determinants  of Health   Financial Resource Strain: Not on file  Food Insecurity: Not on file  Transportation Needs: Not on file  Physical Activity: Not on file  Stress: Not on file  Social Connections: Not on file  Intimate Partner Violence: Not on file    Outpatient Medications Prior to Visit  Medication Sig Dispense Refill  . acetaminophen (TYLENOL) 500 MG tablet Take 500 mg by  mouth every 6 (six) hours as needed.    . calcium carbonate (OS-CAL) 600 MG TABS tablet Take 600 mg by mouth 2 (two) times daily with a meal.    . carvedilol (COREG) 12.5 MG tablet TAKE ONE TABLET BY MOUTH TWICE A DAY WITH FOOD 180 tablet 3  . Cholecalciferol (VITAMIN D3) 50 MCG (2000 UT) TABS Take 50 mcg by mouth daily. 90 tablet 3  . docusate sodium (COLACE) 100 MG capsule Take 300 mg by mouth at bedtime.    . fluticasone (FLONASE) 50 MCG/ACT nasal spray One spray in each nostril twice a day, use left hand for right nostril, and right hand for left nostril. 48 g 3  . levothyroxine (SYNTHROID) 25 MCG tablet Take 25 mcg by mouth daily.    . magnesium oxide (MAG-OX) 400 MG tablet Take by mouth.    . Melatonin 3 MG CAPS Take by mouth.    . Omeprazole 20 MG TBEC Take 1 tablet (20 mg total) by mouth daily. 90 tablet 1  . psyllium (METAMUCIL) 58.6 % powder Take 1 packet by mouth daily.    . vitamin B-12 (CYANOCOBALAMIN) 1000 MCG tablet Take by mouth.     No facility-administered medications prior to visit.    Allergies  Allergen Reactions  . Cymbalta [Duloxetine Hcl] Palpitations  . Lidocaine Hcl Other (See Comments)    Patient stated, "nearly died"   . Lidocaine-Epinephrine Other (See Comments)    Patient states "nearly died"  . Paroxetine Palpitations  . Penicillins Other (See Comments)    Headache, blood pressure drops  . Risedronate Other (See Comments)    Skin growths   . Sulfa Antibiotics Rash and Other (See Comments)  . Other Other (See Comments)    Uncoded Allergy. Allergen: Xylocaine with epinephrine    Review of Systems All review of systems negative except what is listed in the HPI   PHQ9 SCORE ONLY 11/20/2020 08/30/2018 08/02/2018  PHQ-9 Total Score 10 2 13      GAD 7 : Generalized Anxiety Score 11/20/2020 08/30/2018 08/02/2018  Nervous, Anxious, on Edge 3 0 2  Control/stop worrying 3 1 3   Worry too much - different things 2 0 3  Trouble relaxing 1 0 2  Restless 0 0 1   Easily annoyed or irritable 1 0 2  Afraid - awful might happen 0 0 0  Total GAD 7 Score 10 1 13   Anxiety Difficulty Somewhat difficult Not difficult at all Somewhat difficult        Objective:    Physical Exam Vitals reviewed.  Constitutional:      Appearance: Normal appearance.  HENT:     Mouth/Throat:     Mouth: Mucous membranes are moist.     Pharynx: Oropharynx is clear.  Cardiovascular:     Rate and Rhythm: Normal rate and regular rhythm.     Pulses: Normal pulses.     Heart sounds: Normal heart sounds.     Comments:  a few early beats/PVCs heard Pulmonary:     Effort: Pulmonary effort is normal.     Breath  sounds: Normal breath sounds.  Abdominal:     Palpations: Abdomen is soft.  Skin:    General: Skin is warm and dry.     Capillary Refill: Capillary refill takes less than 2 seconds.  Neurological:     General: No focal deficit present.     Mental Status: She is alert and oriented to person, place, and time.  Psychiatric:        Mood and Affect: Mood normal.        Behavior: Behavior normal.        Thought Content: Thought content normal.        Judgment: Judgment normal.     BP 110/73   Pulse 72   Wt 157 lb (71.2 kg)   SpO2 98%   BMI 26.95 kg/m  Wt Readings from Last 3 Encounters:  11/20/20 157 lb (71.2 kg)  09/26/20 158 lb (71.7 kg)  07/20/20 158 lb (71.7 kg)    Health Maintenance Due  Topic Date Due  . Hepatitis C Screening  Never done  . PNA vac Low Risk Adult (1 of 2 - PCV13) Never done  . COVID-19 Vaccine (3 - Pfizer risk 4-dose series) 11/30/2019  . TETANUS/TDAP  08/18/2020    There are no preventive care reminders to display for this patient.   Lab Results  Component Value Date   TSH 2.55 07/20/2020   Lab Results  Component Value Date   WBC 7.4 09/26/2020   HGB 12.7 09/26/2020   HCT 38.9 09/26/2020   MCV 84.0 09/26/2020   PLT 285 09/26/2020   Lab Results  Component Value Date   NA 133 (L) 09/26/2020   K 4.6 09/26/2020    CO2 32 09/26/2020   GLUCOSE 92 09/26/2020   BUN 10 09/26/2020   CREATININE 0.65 09/26/2020   BILITOT 0.5 09/26/2020   ALKPHOS 69 09/26/2020   AST 10 (L) 09/26/2020   ALT 7 09/26/2020   PROT 6.5 09/26/2020   ALBUMIN 4.3 09/26/2020   CALCIUM 9.5 09/26/2020   ANIONGAP 6 09/26/2020   Lab Results  Component Value Date   CHOL 205 (H) 03/14/2020   Lab Results  Component Value Date   HDL 48 (L) 03/14/2020   Lab Results  Component Value Date   LDLCALC 129 (H) 03/14/2020   Lab Results  Component Value Date   TRIG 163 (H) 03/14/2020   Lab Results  Component Value Date   CHOLHDL 4.3 03/14/2020   No results found for: HGBA1C     Assessment & Plan:   1. Palpitations 2. Chest tightness  Patient with 2+ weeks of chest tightness with intermittent palpitations. No acute distress. Symptoms do not interfere with her daily activities. EKG in office today showed SR with frequent PVCs - PVCs heard on exam. Given her symptoms, EKG, and family history of sudden cardiac death in mother (age 80's) and grandfather (age 54's), I would like to go ahead and do a 48-hour Holter monitor and echocardiogram. I would also like to check some labs for other causes of her symptoms including thyroid, anemias, electrolyte imbalance, dehydration, etc. Will also check a troponin today for cardiac etiology.   I do think there is some stress/anxiety component given her JIR/CVE93 scores. She is not interested in counseling or pharmacologic management at this time. I think we need to rule out other causes as well including those previously described. We will follow-up and closely monitor anxiety/depression.  - EKG 12-Lead - TSH - CBC - BASIC METABOLIC PANEL  WITH GFR - Troponin I - ECHOCARDIOGRAM COMPLETE; Future - Holter monitor - 48 hour; Future  She was educated on signs and symptoms that would require urgent evaluation. Follow-up if symptoms worsen or fail to improve. Will follow-up when test results are  in.     Purcell Nails Olevia Bowens, DNP, FNP-C

## 2020-11-21 LAB — BASIC METABOLIC PANEL WITH GFR
BUN: 16 mg/dL (ref 7–25)
CO2: 29 mmol/L (ref 20–32)
Calcium: 9.2 mg/dL (ref 8.6–10.4)
Chloride: 96 mmol/L — ABNORMAL LOW (ref 98–110)
Creat: 0.65 mg/dL (ref 0.60–0.93)
GFR, Est African American: 99 mL/min/{1.73_m2} (ref >=60)
GFR, Est Non African American: 86 mL/min/{1.73_m2} (ref >=60)
Glucose, Bld: 99 mg/dL (ref 65–99)
Potassium: 4.4 mmol/L (ref 3.5–5.3)
Sodium: 133 mmol/L — ABNORMAL LOW (ref 135–146)

## 2020-11-21 LAB — CBC
HCT: 41.3 % (ref 35.0–45.0)
Hemoglobin: 13.3 g/dL (ref 11.7–15.5)
MCH: 27 pg (ref 27.0–33.0)
MCHC: 32.2 g/dL (ref 32.0–36.0)
MCV: 83.9 fL (ref 80.0–100.0)
MPV: 10.5 fL (ref 7.5–12.5)
Platelets: 299 10*3/uL (ref 140–400)
RBC: 4.92 10*6/uL (ref 3.80–5.10)
RDW: 14 % (ref 11.0–15.0)
WBC: 8 10*3/uL (ref 3.8–10.8)

## 2020-11-21 LAB — TSH: TSH: 2.36 mIU/L (ref 0.40–4.50)

## 2020-11-21 LAB — TROPONIN I: Troponin I: <3 ng/L (ref <=47)

## 2020-11-21 NOTE — Progress Notes (Signed)
MyChart message: Your labs look good! Sodium and chloride continue to be a little bit low, but this is consistent with your last several lab draws. Let's see what the rest of the cardiac workup tells [place]. Try to limit your caffeine and reduce stressors. Let us know if you need anything in the meantime!

## 2020-11-23 ENCOUNTER — Encounter: Payer: Self-pay | Admitting: Family

## 2020-12-14 ENCOUNTER — Other Ambulatory Visit: Payer: Self-pay

## 2020-12-14 ENCOUNTER — Ambulatory Visit: Payer: Medicare Other | Admitting: Podiatry

## 2020-12-14 DIAGNOSIS — M779 Enthesopathy, unspecified: Secondary | ICD-10-CM

## 2020-12-14 DIAGNOSIS — Q666 Other congenital valgus deformities of feet: Secondary | ICD-10-CM

## 2020-12-14 NOTE — Progress Notes (Signed)
Patient was seen today to [facility]. She was seen by Cranford Mon, CMA. Oral and written break-in instructions provided. Follow up in 4-6 weeks if needed to check orthotics. She was advised to let us know if there are any issues/concerns/questions for the inserts.

## 2020-12-14 NOTE — Patient Instructions (Signed)

## 2020-12-31 ENCOUNTER — Other Ambulatory Visit (HOSPITAL_BASED_OUTPATIENT_CLINIC_OR_DEPARTMENT_OTHER): Payer: Medicare Other

## 2021-01-02 ENCOUNTER — Telehealth: Payer: Self-pay | Admitting: Osteopathic Medicine

## 2021-01-02 NOTE — Chronic Care Management (AMB) (Signed)
  Chronic Care Management   Note  01/02/2021 Name: LOTOYA CASELLA MRN: [id number] DOB: 12-May-1941  PAULETTA PICKNEY is a 80 y.o. year old female who is a primary care patient of Emeterio Reeve, DO. I reached out to Katharine Look by phone today in response to a referral sent by Ms. Sharyon Cable Mehra's PCP, Emeterio Reeve, DO.   Ms. Romano was given information about Chronic Care Management services today including:  1. CCM service includes personalized support from designated clinical staff supervised by her physician, including individualized plan of care and coordination with other care providers 2. 24/7 contact phone numbers for assistance for urgent and routine care needs. 3. Service will only be billed when office clinical staff spend 20 minutes or more in a month to coordinate care. 4. Only one practitioner may furnish and bill the service in a calendar month. 5. The patient may stop CCM services at any time (effective at the end of the month) by phone call to the office staff.   Patient did not agree to enrollment in care management services and does not wish to consider at this time.  Follow up plan:   Lauretta Grill [facility] Scheduler

## 2021-01-03 ENCOUNTER — Ambulatory Visit (INDEPENDENT_AMBULATORY_CARE_PROVIDER_SITE_OTHER): Payer: Medicare Other | Admitting: Osteopathic Medicine

## 2021-01-03 ENCOUNTER — Encounter: Payer: Self-pay | Admitting: Osteopathic Medicine

## 2021-01-03 ENCOUNTER — Other Ambulatory Visit: Payer: Self-pay

## 2021-01-03 VITALS — BP 118/60 | HR 86 | Temp 98.2°F | Wt 157.1 lb

## 2021-01-03 DIAGNOSIS — K5909 Other constipation: Secondary | ICD-10-CM

## 2021-01-03 DIAGNOSIS — K581 Irritable bowel syndrome with constipation: Secondary | ICD-10-CM | POA: Diagnosis not present

## 2021-01-03 MED ORDER — LUBIPROSTONE 24 MCG PO CAPS: 24.0000 ug | ORAL_CAPSULE | Freq: Two times a day (BID) | ORAL | 1 refills | Status: DC

## 2021-01-03 NOTE — Progress Notes (Signed)
Barbara Simpson is a 80 y.o. female who presents to  [place] [facility]  today, 01/03/21, seeking care for the following:  . Chronic constipation 2+ years. Previously on Linzess, unsure dose but it didn't help at all. Has been needing to use suppositories to go at all. Frequent use OTC laxatives and stool softeners, not sure if she's using stimulant laxatives frequently. Reprots good water intake, w/ three 16-oz bottles of water daily. Reprots use of fiber supplement and higher fiber diet, reports decent physical activity. No abdominal pain btu does have bloating. Large uncomfortable BM every 3-5 days which requires straining to evacuate,      ASSESSMENT & PLAN with other pertinent findings:  The primary encounter diagnosis was Chronic constipation. A diagnosis of Irritable bowel syndrome with constipation was also pertinent to this visit.    Patient Instructions  CONSTIPATION:  ALL THE TIME:  Lifestyle measures  Hydration: drink plenty of water  Physical activity: at least walking daily  High-fiber foods Bulk forming laxatives   Psyllium (Konsyl; Metamucil; Perdiem)  Methylcellulose (Citrucel)  Calcium polycarbophil (FiberCon; Fiber-Lax; Mitrolan)  Wheat dextrin (Benefiber)  AS NEEDED FOR 3-5 DAYS AT A TIME OR ALL THE TIME 1-2 TIMES PER WEEK  (CAN TAKE ONE FROM EACH CATEGORY E.G. MIRALAX + SENNA) Hyperosmolar or saline laxatives:  Polyethylene glycol (MiraLAX, GlycoLax)  Magnesium hydroxide (Milk of Magnesia)   Magnesium citrate (Evac-Q-Mag)   Stimulant laxatives - avoid frequent use   Senna (eg, Black Draught, Ex-Lax, Fletcher's, Castoria, Senokot)   Bisacodyl (eg, Correctol, Doxidan, Dulcolax).  PRESCRIPTION sent to treat chronic constipation.   Lubiprostone (Amitiza)  Linaclotide (Linzess) - tired in the past, may have been a low dose? Will consider this.    If this isn't helping, will send to GI  specialist for further evaluation.       No orders of the defined types were placed in this encounter.   Meds ordered this encounter  Medications  . lubiprostone (AMITIZA) 24 MCG capsule    Sig: Take 1 capsule (24 mcg total) by mouth 2 (two) times daily with a meal.    Dispense:  60 capsule    Refill:  1    If PA needed: has tried/failed Linzess, and multiple OTC constipation treatment     See below for relevant physical exam findings  See below for recent lab and imaging results reviewed  Medications, allergies, PMH, PSH, SocH, FamH reviewed below    Follow-up instructions: Return if symptoms worsen or fail to improve.                                        Exam:  BP 118/60 (BP Location: Left Arm, Patient Position: Sitting, Cuff Size: Normal)   Pulse 86   Temp 98.2 F (36.8 C) (Oral)   Wt 157 lb 1.9 oz (71.3 kg)   BMI 26.97 kg/m   Constitutional: VS see above. General Appearance: alert, well-developed, well-nourished, NAD  Neck: No masses, trachea midline.   Respiratory: Normal respiratory effort. no wheeze, no rhonchi, no rales  Cardiovascular: S1/S2 normal, no murmur, no rub/gallop auscultated. RRR.   Musculoskeletal: Gait normal. Symmetric and independent movement of all extremities  Abdominal: non-tender, non-distended, no appreciable organomegaly, neg Murphy's, BS WNLx4  Neurological: Normal balance/coordination. No tremor.  Skin: warm, dry, intact.   Psychiatric: Normal judgment/insight. Normal mood and affect.  Oriented x3.   Current Meds  Medication Sig  . acetaminophen (TYLENOL) 500 MG tablet Take 500 mg by mouth every 6 (six) hours as needed.  . calcium carbonate (OS-CAL) 600 MG TABS tablet Take 600 mg by mouth 2 (two) times daily with a meal.  . carvedilol (COREG) 12.5 MG tablet TAKE ONE TABLET BY MOUTH TWICE A DAY WITH FOOD  . Cholecalciferol (VITAMIN D3) 50 MCG (2000 UT) TABS Take 50 mcg by mouth daily.  Marland Kitchen  docusate sodium (COLACE) 100 MG capsule Take 300 mg by mouth at bedtime.  . fluticasone (FLONASE) 50 MCG/ACT nasal spray One spray in each nostril twice a day, use left hand for right nostril, and right hand for left nostril.  Marland Kitchen levothyroxine (SYNTHROID) 25 MCG tablet Take 25 mcg by mouth daily.  Marland Kitchen lubiprostone (AMITIZA) 24 MCG capsule Take 1 capsule (24 mcg total) by mouth 2 (two) times daily with a meal.  . magnesium oxide (MAG-OX) 400 MG tablet Take by mouth.  . Melatonin 3 MG CAPS Take by mouth.  . Omeprazole 20 MG TBEC Take 1 tablet (20 mg total) by mouth daily.  . vitamin B-12 (CYANOCOBALAMIN) 1000 MCG tablet Take by mouth.    Allergies  Allergen Reactions  . Cymbalta [Duloxetine Hcl] Palpitations  . Lidocaine Hcl Other (See Comments)    Patient stated, "nearly died"   . Lidocaine-Epinephrine Other (See Comments)    Patient states "nearly died"  . Paroxetine Palpitations  . Penicillins Other (See Comments)    Headache, blood pressure drops  . Risedronate Other (See Comments)    Skin growths   . Sulfa Antibiotics Rash and Other (See Comments)  . Other Other (See Comments)    Uncoded Allergy. Allergen: Xylocaine with epinephrine    Patient Active Problem List   Diagnosis Date Noted  . Irritable bowel syndrome with constipation 01/03/2021  . Chronic constipation 01/03/2021  . Malignant neoplasm of left breast in female, estrogen receptor positive ([place]) 09/26/2020  . Primary osteoarthritis of left foot 11/15/2019  . Cervicalgia 08/18/2019  . Generalized anxiety disorder 08/03/2018  . 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 History of breast cancer 08/03/2018  . Age-related osteoporosis without current pathological fracture 08/03/2018  . H/O total hysterectomy 08/03/2018  . History of primary hyperparathyroidism 07/20/2018  . History of thyroid cancer 07/20/2018    No family history on file.  Social History   Tobacco Use  Smoking Status Never Smoker  Smokeless  Tobacco Never Used    Past Surgical History:  Procedure Laterality Date  . APPENDECTOMY    . BROKEN BONE REPAIR    . CATARACT EXTRACTION, BILATERAL    . HYSTERECTOMY ABDOMINAL WITH SALPINGECTOMY    . NERVE REPAIR     LEFT ARM  . OOPHORECTOMY    . THYROID SURGERY    . TOTAL SHOULDER REPLACEMENT Right     Immunization History  Administered Date(s) Administered  . Fluad Quad(high Dose 65+) 05/04/2019  . Influenza, High Dose Seasonal PF 06/15/2018  . Influenza-Unspecified 07/07/2018, 07/04/2020  . PFIZER(Purple Top)SARS-COV-2 Vaccination 10/09/2019, 11/02/2019  . Tdap 08/18/2010    Recent Results (from the past 2160 hour(s))  TSH     Status: None   Collection Time: 11/20/20 12:00 AM  Result Value Ref Range   TSH 2.36 0.40 - 4.50 mIU/L  CBC     Status: None   Collection Time: 11/20/20 12:00 AM  Result Value Ref Range   WBC 8.0 3.8 - 10.8 Thousand/uL   RBC 4.92 3.80 -  5.10 Million/uL   Hemoglobin 13.3 11.7 - 15.5 g/dL   HCT 41.3 35.0 - 45.0 %   MCV 83.9 80.0 - 100.0 fL   MCH 27.0 27.0 - 33.0 pg   MCHC 32.2 32.0 - 36.0 g/dL   RDW 14.0 11.0 - 15.0 %   Platelets 299 140 - 400 Thousand/uL   MPV 10.5 7.5 - 12.5 fL  BASIC METABOLIC PANEL WITH GFR     Status: Abnormal   Collection Time: 11/20/20 12:00 AM  Result Value Ref Range   Glucose, Bld 99 65 - 99 mg/dL    Comment: .            Fasting reference interval .    BUN 16 7 - 25 mg/dL   Creat 0.65 0.60 - 0.93 mg/dL    Comment: For patients >59 years of age, the reference limit for Creatinine is approximately 13% higher for people identified as African-American. .    GFR, Est Non African American 86 > OR = 60 mL/min/1.30m2   GFR, Est African American 99 > OR = 60 mL/min/1.32m2   BUN/Creatinine Ratio NOT APPLICABLE 6 - 22 (calc)   Sodium 133 (L) 135 - 146 mmol/L   Potassium 4.4 3.5 - 5.3 mmol/L   Chloride 96 (L) 98 - 110 mmol/L   CO2 29 20 - 32 mmol/L   Calcium 9.2 8.6 - 10.4 mg/dL  Troponin I     Status: None    Collection Time: 11/20/20 12:00 AM  Result Value Ref Range   Troponin I <3 < OR = 47 ng/L    Comment: . In accord with published recommendations, serial testing of troponin I at intervals of 2 to 4 hours for up to 12 to 24 hours is suggested in order to corroborate a single troponin I result. An elevated troponin alone is not sufficient to make the diagnosis of MI. Marland Kitchen     No results found.     All questions at time of visit were answered - patient instructed to contact office with any additional concerns or updates. ER/RTC precautions were reviewed with the patient as applicable.   Please note: manual typing as well as voice recognition software may have been used to produce this document - typos may escape review. Please contact Dr. Sheppard Coil for any needed clarifications.

## 2021-01-03 NOTE — Patient Instructions (Addendum)
CONSTIPATION:  ALL THE TIME:  Lifestyle measures  Hydration: drink plenty of water  Physical activity: at least walking daily  High-fiber foods Bulk forming laxatives   Psyllium (Konsyl; Metamucil; Perdiem)  Methylcellulose (Citrucel)  Calcium polycarbophil (FiberCon; Fiber-Lax; Mitrolan)  Wheat dextrin (Benefiber)  AS NEEDED FOR 3-5 DAYS AT A TIME OR ALL THE TIME 1-2 TIMES PER WEEK  (CAN TAKE ONE FROM EACH CATEGORY E.G. MIRALAX + SENNA) Hyperosmolar or saline laxatives:  Polyethylene glycol (MiraLAX, GlycoLax)  Magnesium hydroxide (Milk of Magnesia)   Magnesium citrate (Evac-Q-Mag)   Stimulant laxatives - avoid frequent use   Senna (eg, Black Draught, Ex-Lax, Fletcher's, Castoria, Senokot)   Bisacodyl (eg, Correctol, Doxidan, Dulcolax).  PRESCRIPTION sent to treat chronic constipation.   Lubiprostone (Amitiza)  Linaclotide (Linzess) - tired in the past, may have been a low dose? Will consider this.    If this isn't helping, will send to GI specialist for further evaluation.

## 2021-01-15 ENCOUNTER — Encounter: Payer: Self-pay | Admitting: Osteopathic Medicine

## 2021-01-15 ENCOUNTER — Telehealth: Payer: Self-pay

## 2021-01-15 DIAGNOSIS — K5909 Other constipation: Secondary | ICD-10-CM

## 2021-01-15 DIAGNOSIS — K581 Irritable bowel syndrome with constipation: Secondary | ICD-10-CM

## 2021-01-15 NOTE — Telephone Encounter (Signed)
Referral signed MyChart message sent to patient to update her

## 2021-01-15 NOTE — Telephone Encounter (Signed)
Pt called stating that the Amitiza rx is not working to relieve her IBS issues. She is requesting a GI specialist. Referral pended.

## 2021-01-18 ENCOUNTER — Ambulatory Visit: Payer: Medicare Other | Admitting: Podiatry

## 2021-01-18 ENCOUNTER — Encounter: Payer: Self-pay | Admitting: Podiatry

## 2021-01-18 ENCOUNTER — Other Ambulatory Visit: Payer: Self-pay

## 2021-01-18 DIAGNOSIS — M778 Other enthesopathies, not elsewhere classified: Secondary | ICD-10-CM

## 2021-01-18 DIAGNOSIS — M79672 Pain in left foot: Secondary | ICD-10-CM

## 2021-01-18 MED ORDER — TRIAMCINOLONE ACETONIDE 10 MG/ML IJ SUSP
10.0000 mg | Freq: Once | INTRAMUSCULAR | Status: AC
  Administered 2021-01-18: 10 mg

## 2021-01-18 NOTE — Patient Instructions (Signed)

## 2021-01-25 NOTE — Progress Notes (Signed)
Subjective: 80 year old female presents the office today for follow-up evaluation of foot pain.  She said the inserts did feel better when she had them on but it is too hot to wear them inside of her regular shoes and also cause some right knee pain.  Denies any recent injury or trauma.  She states she still gets some swelling discomfort of the lateral aspect of the left foot. Denies any systemic complaints such as fevers, chills, nausea, vomiting. No acute changes since last appointment, and no other complaints at this time.   Objective: AAO x3, NAD DP/PT pulses palpable bilaterally, CRT less than 3 seconds The majority tenderness today is on the lateral aspect of the left foot on the sinus tarsi.  There is localized edema to this area.  Mild edema to the peroneal tendon but there is no significant tenderness to palpation of this area.  No area of pinpoint tenderness.  Subtalar and ankle joint range of motion intact.  MMT 5/5. No pain with calf compression, swelling, warmth, erythema  Assessment: 80 year old female with capsulitis left  Plan: -All treatment options discussed with the patient including all alternatives, risks, complications.  -Steroid injection performed to the left side.  Skin was prepped with Betadine, alcohol and mixture of 1 cc Kenalog 10, 1 cc nesacaine was injected into the sinus tarsi on the left side without complications.  Postinjection care discussed.  Tolerated well. -She does have the orthotics with her.  I asked her to bring them back in order to make them fit better for when she is able to wear them inside close shoes.  Likely need to decrease the arch on the right side it is causing the pain. -Patient encouraged to call the office with any questions, concerns, change in symptoms.   Trula Slade DPM

## 2021-03-01 ENCOUNTER — Encounter: Payer: Self-pay | Admitting: Gastroenterology

## 2021-03-01 ENCOUNTER — Other Ambulatory Visit (INDEPENDENT_AMBULATORY_CARE_PROVIDER_SITE_OTHER): Payer: Medicare Other

## 2021-03-01 ENCOUNTER — Ambulatory Visit (INDEPENDENT_AMBULATORY_CARE_PROVIDER_SITE_OTHER): Payer: Medicare Other | Admitting: Gastroenterology

## 2021-03-01 ENCOUNTER — Other Ambulatory Visit: Payer: Self-pay

## 2021-03-01 VITALS — BP 110/78 | HR 75 | Ht 64.0 in | Wt 154.1 lb

## 2021-03-01 DIAGNOSIS — K5909 Other constipation: Secondary | ICD-10-CM

## 2021-03-01 DIAGNOSIS — R1032 Left lower quadrant pain: Secondary | ICD-10-CM

## 2021-03-01 LAB — COMPREHENSIVE METABOLIC PANEL
AG Ratio: 2 (calc) (ref 1.0–2.5)
ALT: 9 U/L (ref 6–29)
AST: 11 U/L (ref 10–35)
Albumin: 4.4 g/dL (ref 3.6–5.1)
Alkaline phosphatase (APISO): 69 U/L (ref 37–153)
BUN: 12 mg/dL (ref 7–25)
CO2: 28 mmol/L (ref 20–32)
Calcium: 9.4 mg/dL (ref 8.6–10.4)
Chloride: 94 mmol/L — ABNORMAL LOW (ref 98–110)
Creat: 0.68 mg/dL (ref 0.60–1.00)
Globulin: 2.2 g/dL (calc) (ref 1.9–3.7)
Glucose, Bld: 95 mg/dL (ref 65–99)
Potassium: 4.5 mmol/L (ref 3.5–5.3)
Sodium: 131 mmol/L — ABNORMAL LOW (ref 135–146)
Total Bilirubin: 0.4 mg/dL (ref 0.2–1.2)
Total Protein: 6.6 g/dL (ref 6.1–8.1)

## 2021-03-01 NOTE — Patient Instructions (Addendum)
If you are age 78 or older, your body mass index should be between 23-30. Your Body mass index is 26.46 kg/m. If this is out of the aforementioned range listed, please consider follow up with your Primary Care Provider.  If you are age 58 or younger, your body mass index should be between 19-25. Your Body mass index is 26.46 kg/m. If this is out of the aformentioned range listed, please consider follow up with your Primary Care Provider.   __________________________________________________________  The [facility] providers would like to encourage you to use [facility] to communicate with providers for non-urgent requests or questions.  Due to long hold times on the telephone, sending your provider a message by [facility] may be a faster and more efficient way to get a response.  Please allow 48 business hours for a response.  Please remember that this is for non-urgent requests.   Please go to the lab on the 2nd floor [address] before you leave the office today.   We have given you samples of the following medication to take: Trulance  Continue magnesium  You have been scheduled for a CT scan of the abdomen and pelvis at [facility]. Phone number is [phone]  You are scheduled on     at  . You should arrive 15 minutes prior to your appointment time for registration. Please follow the written instructions below on the day of your exam:  WARNING: IF YOU ARE ALLERGIC TO IODINE/X-RAY DYE, PLEASE NOTIFY RADIOLOGY IMMEDIATELY AT [phone]! YOU WILL BE GIVEN A 13 HOUR PREMEDICATION PREP.  1) Do not eat or drink anything after      (4 hours prior to your test) 2) You have been given 2 bottles of oral contrast to drink. The solution may taste better if refrigerated, but do NOT add ice or any other liquid to this solution. Shake well before drinking.    Drink 1 bottle of contrast @      (2 hours prior to your exam)  Drink 1 bottle of contrast @       (1 hour prior to your exam)  You  may take any medications as prescribed with a small amount of water, if necessary. If you take any of the following medications: METFORMIN, GLUCOPHAGE, GLUCOVANCE, AVANDAMET, RIOMET, FORTAMET, [place] MET, JANUMET, GLUMETZA or METAGLIP, you MAY be asked to HOLD this medication 48 hours AFTER the exam.  The purpose of you drinking the oral contrast is to aid in the visualization of your intestinal tract. The contrast solution may cause some diarrhea. Depending on your individual set of symptoms, you may also receive an intravenous injection of x-ray contrast/dye. Plan on being at [facility] for 30 minutes or longer, depending on the type of exam you are having performed.  This test typically takes 30-45 minutes to complete.  If you have any questions regarding your exam or if you need to reschedule, you may call the CT department at [phone] between the hours of 8:00 am and 5:00 pm, Monday-Friday.  ________________________________________________________________________

## 2021-03-01 NOTE — Progress Notes (Signed)
Chief Complaint: Constipation.  Referring Provider:  Emeterio Reeve, DO      ASSESSMENT AND PLAN;   #1. Chronic constipation. Nl TSH. Neg multiple colonoscopies most recently 2019  #2. LLQ pain. ?Etiology.  Plan: -Mg 400mg  po QOD (Nl  renal function) -CT AP with PO IV contrast.  Per radiology check CMP before. -Trulance samples.  1 tab po qd -Continue to drink plenty of water. -Start walking 30 min/day. -FU thereafter.   HPI:    Barbara Simpson is a 80 y.o. female   C/O longstanding constipation since childhood, has been taking prunes previously.  Worsening over the last 2 years.  Has Bms 1/2-3 days, hard, large balls lately with suppositories.  She has used multiple OTC laxatives and stool softeners.  Has been given trial of Linzess which did not work.  Magnesium 400 mg caused her to have diarrhea.  However, 200 mg did not work.  No melena or hematochezia.  No weight loss.  Has been having new onset left lower quadrant abdominal pain intermittently with associated abdominal bloating.  No fever chills or night sweats. The pain does get somewhat worse with bowel movements.  Had multiple colonoscopies in the past starting age 39.  Most recent colonoscopy was 3 years ago in [facility] which was negative.  She was told that she would not need any further colonoscopies due to age.  She denies having any heartburn as long as she takes omeprazole, regurgitation, odynophagia or dysphagia.  She does not drink plenty of water.  No sodas, chocolates, chewing gums, artificial sweeteners and candy. No NSAIDs   SH-3 children, oldest is daughter who also has constipation, her mother had constipation as well.  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   Thyroid cancer ([place]) 07/20/2018    Past Surgical History:  Procedure Laterality Date   APPENDECTOMY     BROKEN BONE REPAIR     CATARACT EXTRACTION, BILATERAL     HYSTERECTOMY ABDOMINAL WITH SALPINGECTOMY     NERVE REPAIR     LEFT ARM   OOPHORECTOMY     THYROID SURGERY     TOTAL SHOULDER REPLACEMENT Right     Family History  Problem Relation Age of Onset   Alzheimer's disease Mother    Ulcers Father    Emphysema Father    Bone cancer Father    Colon cancer Neg Hx    Stomach cancer Neg Hx    Esophageal cancer Neg Hx    Pancreatic cancer Neg Hx    Liver disease Neg Hx     Social History   Tobacco Use   Smoking status: Never   Smokeless tobacco: Never  Vaping Use   Vaping Use: Never used  Substance Use Topics   Alcohol use: Never   Drug use: Never    Current Outpatient Medications  Medication Sig Dispense Refill   acetaminophen (TYLENOL) 500 MG tablet Take 500 mg by mouth every 6 (six) hours as needed.     calcium carbonate (OS-CAL) 600 MG TABS tablet Take 600 mg by mouth 2 (two) times daily with a meal.     carvedilol (  COREG) 12.5 MG tablet TAKE ONE TABLET BY MOUTH TWICE A DAY WITH FOOD 180 tablet 3   Cholecalciferol (VITAMIN D3) 50 MCG (2000 UT) TABS Take 50 mcg by mouth daily. 90 tablet 3   docusate sodium (COLACE) 100 MG capsule Take 300 mg by mouth at bedtime.     fluticasone (FLONASE) 50 MCG/ACT nasal spray One spray in each nostril twice a day, use left hand for right nostril, and right hand for left nostril. 48 g 3   Omeprazole 20 MG TBEC Take 1 tablet (20 mg total) by mouth daily. 90 tablet 1   SYNTHROID 50 MCG tablet Take by mouth.     vitamin B-12 (CYANOCOBALAMIN) 1000 MCG tablet Take by mouth.     No current facility-administered medications for this visit.    Allergies  Allergen Reactions   Cymbalta  [Duloxetine Hcl] Palpitations   Lidocaine Hcl Other (See Comments)    Patient stated, "nearly died"    Lidocaine-Epinephrine Other (See Comments)    Patient states "nearly died"   Paroxetine Palpitations   Penicillins Other (See Comments)    Headache, blood pressure drops   Risedronate Other (See Comments)    Skin growths    Sulfa Antibiotics Rash and Other (See Comments)   Other Other (See Comments)    Uncoded Allergy. Allergen: Xylocaine with epinephrine    Review of Systems:  Constitutional: Denies fever, chills, diaphoresis, appetite change and fatigue.  HEENT: Denies photophobia, eye pain, redness, hearing loss, ear pain, congestion, sore throat, rhinorrhea, sneezing, mouth sores, neck pain, neck stiffness and tinnitus.   Respiratory: Denies SOB, DOE, cough, chest tightness,  and wheezing.   Cardiovascular: Denies chest pain, palpitations and leg swelling.  Genitourinary: Denies dysuria, urgency, frequency, hematuria, flank pain and difficulty urinating.  Musculoskeletal: Denies myalgias, back pain, joint swelling, arthralgias and gait problem.  Skin: No rash.  Neurological: Denies dizziness, seizures, syncope, weakness, light-headedness, numbness and headaches.  Hematological: Denies adenopathy. Easy bruising, personal or family bleeding history  Psychiatric/Behavioral: No anxiety or depression     Physical Exam:    BP 110/78   Pulse 75   Ht 5\' 4"  (1.626 m)   Wt 154 lb 2 oz (69.9 kg) Comment: verbal by patient  SpO2 99%   BMI 26.46 kg/m  Wt Readings from Last 3 Encounters:  03/01/21 154 lb 2 oz (69.9 kg)  01/03/21 157 lb 1.9 oz (71.3 kg)  11/20/20 157 lb (71.2 kg)   Constitutional:  Well-developed, in no acute distress. Psychiatric: Normal mood and affect. Behavior is normal. HEENT: Pupils normal.  Conjunctivae are normal. No scleral icterus. Cardiovascular: Normal rate, regular rhythm. No edema Pulmonary/chest: Effort normal and breath sounds normal. No  wheezing, rales or rhonchi. Abdominal: Soft, nondistended.  Mild lower abdominal tenderness.. Bowel sounds active throughout. There are no masses palpable. No hepatomegaly.  Well-healed surgical scars. Rectal: Deferred Neurological: Alert and oriented to person place and time. Skin: Skin is warm and dry. No rashes noted.  Data Reviewed: I have personally reviewed following labs and imaging studies  CBC: CBC Latest Ref Rng & Units 11/20/2020 09/26/2020 07/20/2020  WBC 3.8 - 10.8 Thousand/uL 8.0 7.4 6.4  Hemoglobin 11.7 - 15.5 g/dL 13.3 12.7 13.0  Hematocrit 35.0 - 45.0 % 41.3 38.9 39.9  Platelets 140 - 400 Thousand/uL 299 285 301    CMP: CMP Latest Ref Rng & Units 11/20/2020 09/26/2020 07/20/2020  Glucose 65 - 99 mg/dL 99 92 94  BUN 7 - 25 mg/dL 16 10 9  Creatinine 0.60 - 0.93 mg/dL 0.65 0.65 0.57(L)  Sodium 135 - 146 mmol/L 133(L) 133(L) 134(L)  Potassium 3.5 - 5.3 mmol/L 4.4 4.6 4.5  Chloride 98 - 110 mmol/L 96(L) 95(L) 96(L)  CO2 20 - 32 mmol/L 29 32 29  Calcium 8.6 - 10.4 mg/dL 9.2 9.5 9.2  Total Protein 6.5 - 8.1 g/dL - 6.5 6.4  Total Bilirubin 0.3 - 1.2 mg/dL - 0.5 0.4  Alkaline Phos 38 - 126 U/L - 69 -  AST 15 - 41 U/L - 10(L) 10  ALT 0 - 44 U/L - 7 8      Carmell Austria, MD 03/01/2021, 1:57 PM  Cc: Emeterio Reeve, DO

## 2021-03-04 ENCOUNTER — Ambulatory Visit (INDEPENDENT_AMBULATORY_CARE_PROVIDER_SITE_OTHER): Payer: Medicare Other

## 2021-03-04 ENCOUNTER — Other Ambulatory Visit: Payer: Self-pay

## 2021-03-04 DIAGNOSIS — K5909 Other constipation: Secondary | ICD-10-CM | POA: Diagnosis not present

## 2021-03-04 DIAGNOSIS — R1032 Left lower quadrant pain: Secondary | ICD-10-CM

## 2021-03-04 MED ORDER — IOHEXOL 300 MG/ML  SOLN
100.0000 mL | Freq: Once | INTRAMUSCULAR | Status: AC | PRN
  Administered 2021-03-04: 100 mL via INTRAVENOUS

## 2021-03-07 ENCOUNTER — Telehealth: Payer: Self-pay | Admitting: Gastroenterology

## 2021-03-07 NOTE — Telephone Encounter (Signed)
Patient states recent prescribed med is too strong. Pt requesting alternative med.. Patient has concerns about CT results, requesting to discuss results.. Plz advise thank you

## 2021-03-07 NOTE — Telephone Encounter (Signed)
Spoke with patient. Patient reporting that she recently had CT scan 7/18 and is requesting to know and review results.Patient also states she took one dose Trulance from samples and feels this had made her have uncontrolled diarrhea. States she went to bed and 2 hrs later she had to get up and stool "poured out". Patient states stool is mustard in color. No other changes in medication or diet. Please Advise, thank you  Last OV 7/15  #1. Chronic constipation. Nl TSH. Neg multiple colonoscopies most recently 2019   #2. LLQ pain. ?Etiology.   Plan: -Mg 400mg  po QOD (Nl  renal function) -CT AP with PO IV contrast.  Per radiology check CMP before. -Trulance samples.  1 tab po qd -Continue to drink plenty of water. -Start walking 30 min/day. -FU thereafter.

## 2021-03-08 NOTE — Telephone Encounter (Signed)
CT AP 02/2021: No acute abnormalities. Colonic diverticulosis, without radiographic evidence of diverticulitis or other acute findings.  Layering sludge or tiny gallstones in gallbladder fundus. No radiographic evidence of cholecystitis.  Aortic Atherosclerosis (ICD10-I70.0).  Please send copy to family physician. Can stop using Trulance for now. Go back on MiraLAX 17 g p.o. once a day Let us know how she is.  RG

## 2021-03-11 NOTE — Telephone Encounter (Signed)
Spoke with patient patient made aware of results. Please see rsults note.

## 2021-03-15 ENCOUNTER — Ambulatory Visit: Payer: Medicare Other | Admitting: Podiatry

## 2021-03-15 ENCOUNTER — Other Ambulatory Visit: Payer: Self-pay

## 2021-03-15 ENCOUNTER — Encounter: Payer: Self-pay | Admitting: Podiatry

## 2021-03-15 DIAGNOSIS — M19072 Primary osteoarthritis, left ankle and foot: Secondary | ICD-10-CM

## 2021-03-15 DIAGNOSIS — M778 Other enthesopathies, not elsewhere classified: Secondary | ICD-10-CM | POA: Diagnosis not present

## 2021-03-15 DIAGNOSIS — M79672 Pain in left foot: Secondary | ICD-10-CM

## 2021-03-15 MED ORDER — TRIAMCINOLONE ACETONIDE 10 MG/ML IJ SUSP
5.0000 mg | Freq: Once | INTRAMUSCULAR | Status: AC
  Administered 2021-03-15: 5 mg

## 2021-03-15 NOTE — Patient Instructions (Signed)

## 2021-03-17 NOTE — Progress Notes (Signed)
Subjective: 80 year old female presents the office today for follow-up evaluation of foot pain.  She states that the injection has helped.  She states that she feels best when she wears a flat shoe.  She says she goes shopping by the end the day she can feel it and does restrict her from 2 which she wants to do.  No recent injury to her feet.  She brings with her orthotics.  Left foot orthotic is comfortable with the right side causes knee pain as her knee rolls and she reports with the orthotics.  Objective: AAO x3, NAD DP/PT pulses palpable bilaterally, CRT less than 3 seconds The majority tenderness today is on the lateral aspect of the left foot on the sinus tarsi.  There is localized edema to this area.  No significant tenderness on the peroneal tendon or other areas of the foot.  No pain on the right side. No pain with calf compression, swelling, warmth, erythema  Assessment: 80 year old female with capsulitis left; arthritis  Plan: -All treatment options discussed with the patient including all alternatives, risks, complications.  -I again reviewed the MRI with her.  Discussed with conservative as well as surgical options.  Do think it is accommodation of the arthritis as well as biomechanical changes.  On the work on trying to the orthotics comfortable for her foot when she is able to wear closed in shoe as the weather cools down. -She was to proceed with a  "small" steroid injection.  Steroid injection performed to the left side.  Skin was prepped with Betadine, alcohol and mixture of 0.5 cc Kenalog 10, 0.5 cc nesacaine was injected into the sinus tarsi on the left side without complications.  Postinjection care discussed.  Tolerated well. -Patient encouraged to call the office with any questions, concerns, change in symptoms.   Trula Slade DPM

## 2021-03-27 ENCOUNTER — Other Ambulatory Visit: Payer: Self-pay

## 2021-03-27 ENCOUNTER — Ambulatory Visit (INDEPENDENT_AMBULATORY_CARE_PROVIDER_SITE_OTHER): Payer: Medicare Other | Admitting: Osteopathic Medicine

## 2021-03-27 DIAGNOSIS — Z0000 Encounter for general adult medical examination without abnormal findings: Secondary | ICD-10-CM | POA: Diagnosis not present

## 2021-03-27 NOTE — Progress Notes (Signed)
MEDICARE ANNUAL WELLNESS VISIT  03/27/2021  Telephone Visit Disclaimer This Medicare AWV was conducted by telephone due to national recommendations for restrictions regarding the COVID-19 Pandemic (e.g. social distancing).  I verified, using two identifiers, that I am speaking with Barbara Simpson or their authorized healthcare agent. I discussed the limitations, risks, security, and privacy concerns of performing an evaluation and management service by telephone and the potential availability of an in-person appointment in the future. The patient expressed understanding and agreed to proceed.  Location of Patient: Home Location of Provider (nurse): Provider home  Subjective:    Barbara Simpson is a 80 y.o. female patient of Barbara Reeve, DO who had a Medicare Annual Wellness Visit today via telephone. Barbara Simpson is Retired and lives with their spouse. she has 3 children. she reports that she is socially active and does interact with friends/family regularly. she is minimally physically active and enjoys playing games on the computer.  Patient Care Team: Barbara Reeve, DO as PCP - General (Osteopathic Medicine) Barbara Napoleon, MD as Consulting Physician (Oncology)  Advanced Directives 03/27/2021 09/26/2020 01/27/2020 09/01/2019 01/06/2019  Does Patient Have a Medical Advance Directive? Yes No No No Yes  Type of Advance Directive Living will - - - [place];Living will  Does patient want to make changes to medical advance directive? No - Patient declined - - - -  Would patient like information on creating a medical advance directive? - No - Patient declined No - Patient declined No - Patient declined -    Hospital Utilization Over the Past 12 Months: # of hospitalizations or ER visits: 0 # of surgeries: 1 - eye surgery  Review of Systems    Patient reports that her overall health is unchanged compared to last year.  History obtained from chart review and  the patient  Patient Reported Readings (BP, Pulse, CBG, Weight, etc) none  Pain Assessment Pain : No/denies pain     Current Medications & Allergies (verified) Allergies as of 03/27/2021       Reactions   Cymbalta [duloxetine Hcl] Palpitations   Lidocaine Hcl Other (See Comments)   Patient stated, "nearly died"   Lidocaine-epinephrine Other (See Comments)   Patient states "nearly died"   Paroxetine Palpitations   Penicillins Other (See Comments)   Headache, blood pressure drops   Risedronate Other (See Comments)   Skin growths   Sulfa Antibiotics Rash, Other (See Comments)   Other Other (See Comments)   Uncoded Allergy. Allergen: Xylocaine with epinephrine        Medication List        Accurate as of March 27, 2021 10:36 AM. If you have any questions, ask your nurse or doctor.          acetaminophen 500 MG tablet Commonly known as: TYLENOL Take 500 mg by mouth every 6 (six) hours as needed.   calcium carbonate 600 MG Tabs tablet Commonly known as: OS-CAL Take 600 mg by mouth 2 (two) times daily with a meal.   carvedilol 12.5 MG tablet Commonly known as: COREG TAKE ONE TABLET BY MOUTH TWICE A DAY WITH FOOD   docusate sodium 100 MG capsule Commonly known as: COLACE Take 300 mg by mouth at bedtime.   fluticasone 50 MCG/ACT nasal spray Commonly known as: Flonase One spray in each nostril twice a day, use left hand for right nostril, and right hand for left nostril.   Magnesium 200 MG Chew Chew by mouth. Once a  day; sometimes twice a day.   magnesium hydroxide 400 MG/5ML suspension Commonly known as: MILK OF MAGNESIA Take 15 mLs by mouth daily as needed for mild constipation. As needed.   Omeprazole 20 MG Tbec Take 1 tablet (20 mg total) by mouth daily. What changed: additional instructions   SOMBRA NATURAL PAIN RELIEVING EX Apply topically.   Synthroid 50 MCG tablet Generic drug: levothyroxine Take by mouth. On sundays, she takes 17mg.    vitamin B-12 500 MCG tablet Commonly known as: CYANOCOBALAMIN Take by mouth.   Vitamin D3 50 MCG (2000 UT) Tabs Take 50 mcg by mouth daily. What changed: additional instructions        History (reviewed): Past Medical History:  Diagnosis Date   Age-related osteoporosis without current pathological fracture 08/03/2018   Anxiety    Breast cancer (HCC)    Generalized anxiety disorder 08/03/2018   H/O total hysterectomy 08/03/2018   High blood pressure    History of breast cancer 08/03/2018   History of primary hyperparathyroidism 07/20/2018   Hypothyroidism    Postoperative hypothyroidism 08/03/2018   Thyroid cancer ([place] 07/20/2018   Past Surgical History:  Procedure Laterality Date   APPENDECTOMY     BROKEN BONE REPAIR     CATARACT EXTRACTION, BILATERAL     HYSTERECTOMY ABDOMINAL WITH SALPINGECTOMY     NERVE REPAIR     LEFT ARM   OOPHORECTOMY     THYROID SURGERY     TOTAL SHOULDER REPLACEMENT Right    Family History  Problem Relation Age of Onset   Alzheimer's disease Mother    Ulcers Father    Emphysema Father    Bone cancer Father    Diabetes Daughter    Colon cancer Neg Hx    Stomach cancer Neg Hx    Esophageal cancer Neg Hx    Pancreatic cancer Neg Hx    Liver disease Neg Hx    Social History   Socioeconomic History   Marital status: Married    Spouse name: Barbara Simpson  Number of children: 3   Years of education: 10   Highest education level: 10th grade  Occupational History   Occupation: Retired  Tobacco Use   Smoking status: Never   Smokeless tobacco: Never  Vaping Use   Vaping Use: Never used  Substance and Sexual Activity   Alcohol use: Never   Drug use: Never   Sexual activity: Not Currently    Partners: Male  Other Topics Concern   Not on file  Social History Narrative   Lives with her husband her dog. She enjoys playing computer games in her free time.   Social Determinants of Health   Financial Resource Strain: Low Risk     Difficulty of Paying Living Expenses: Not hard at all  Food Insecurity: No Food Insecurity   Worried About RCharity fundraiserin the Last Year: Never true   RLa [place] the Last Year: Never true  Transportation Needs: No Transportation Needs   Lack of Transportation (Medical): No   Lack of Transportation (Non-Medical): No  Physical Activity: Inactive   Days of Exercise per Week: 0 days   Minutes of Exercise per Session: 0 min  Stress: No Stress Concern Present   Feeling of Stress : Not at all  Social Connections: Socially Isolated   Frequency of Communication with Friends and Family: Once a week   Frequency of Social Gatherings with Friends and Family: Once a week   Attends Religious Services:  Never   Active Member of Clubs or Organizations: No   Attends Club or Organization Meetings: Never   Marital Status: Married    Activities of Daily Living In your present state of health, do you have any difficulty performing the following activities: 03/27/2021  Hearing? N  Vision? Y  Comment had recent surgery; she has floater in her left eye.  Difficulty concentrating or making decisions? N  Walking or climbing stairs? Y  Comment She has difficulty due to her bad knees.  Dressing or bathing? N  Doing errands, shopping? N  Preparing Food and eating ? Y  Comment She had tendon in her left hand due to a fall which causes her difficulty in preparing food.  Using the Toilet? N  In the past six months, have you accidently leaked urine? N  Do you have problems with loss of bowel control? Y  Comment Due to do a medication, she did have loss of bowel control but she has since stopped the medication.  Managing your Medications? N  Managing your Finances? N  Housekeeping or managing your Housekeeping? N  Some recent data might be hidden    Patient Education/ Literacy How often do you need to have someone help you when you read instructions, pamphlets, or other written materials from  your doctor or pharmacy?: 1 - Never What is the last grade level you completed in school?: 10th grade  Exercise Current Exercise Habits: The patient does not participate in regular exercise at present, Exercise limited by: orthopedic condition(s)  Diet Patient reports consuming 3 meals a day and 0 snack(s) a day Patient reports that her primary diet is: Regular Patient reports that she does have regular access to food.   Depression Screen PHQ 2/9 Scores 03/27/2021 11/20/2020 08/30/2018 08/02/2018  PHQ - 2 Score 1 2 0 4  PHQ- 9 Score '3 10 2 13     '$ Fall Risk Fall Risk  03/27/2021  Falls in the past year? 1  Number falls in past yr: 0  Injury with Fall? 0  Risk for fall due to : History of fall(s)  Follow up Education provided;Falls evaluation completed;Falls prevention discussed     Objective:  Barbara Simpson seemed alert and oriented and she participated appropriately during our telephone visit.  Blood Pressure Weight BMI  BP Readings from Last 3 Encounters:  03/01/21 110/78  01/03/21 118/60  11/20/20 110/73   Wt Readings from Last 3 Encounters:  03/01/21 154 lb 2 oz (69.9 kg)  01/03/21 157 lb 1.9 oz (71.3 kg)  11/20/20 157 lb (71.2 kg)   BMI Readings from Last 1 Encounters:  03/01/21 26.46 kg/m    *Unable to obtain current vital signs, weight, and BMI due to telephone visit type  Hearing/Vision  Kamren did not seem to have difficulty with hearing/understanding during the telephone conversation Reports that she has had a formal eye exam by an eye care professional within the past year Reports that she has not had a formal hearing evaluation within the past year *Unable to fully assess hearing and vision during telephone visit type  Cognitive Function: 6CIT Screen 03/27/2021  What Year? 0 points  What month? 0 points  What time? 0 points  Count back from 20 0 points  Months in reverse 0 points  Repeat phrase 0 points  Total Score 0   (Normal:0-7, Significant for  Dysfunction: >8)  Normal Cognitive Function Screening: Yes   Immunization & Health Maintenance Record Immunization History  Administered Date(s)  Administered   Fluad Quad(high Dose 65+) 05/04/2019   Influenza, High Dose Seasonal PF 06/15/2018   Influenza-Unspecified 07/07/2018, 07/04/2020   PFIZER(Purple Top)SARS-COV-2 Vaccination 10/09/2019, 11/02/2019, 08/14/2020   Tdap 08/18/2010    Health Maintenance  Topic Date Due   COVID-19 Vaccine (4 - Booster for [place] series) 04/12/2021 (Originally 11/12/2020)   INFLUENZA VACCINE  05/13/2021 (Originally 03/18/2021)   Zoster Vaccines- Shingrix (1 of 2) 06/27/2021 (Originally 03/25/1960)   TETANUS/TDAP  03/27/2022 (Originally 08/18/2020)   Hepatitis C Screening  03/27/2022 (Originally 03/25/1959)   PNA vac Low Risk Adult (1 of 2 - PCV13) 03/27/2022 (Originally 03/25/2008)   DEXA SCAN  Completed   HPV VACCINES  Aged Out       Assessment  This is a routine wellness examination for Barbara Simpson.  Health Maintenance: Due or Overdue There are no preventive care reminders to display for this patient.   Barbara Simpson does not need a referral for Community Assistance: Care Management:   no Social Work:    no Prescription Assistance:  no Nutrition/Diabetes Education:  no   Plan:  Personalized Goals  Goals Addressed               This Visit's Progress     Patient Stated (pt-stated)        03/27/2021 AWV Goal: Exercise for General Health  Patient will verbalize understanding of the benefits of increased physical activity: Exercising regularly is important. It will improve your overall fitness, flexibility, and endurance. Regular exercise also will improve your overall health. It can help you control your weight, reduce stress, and improve your bone density. Over the next year, patient will increase physical activity as tolerated with a goal of at least 150 minutes of moderate physical activity per week.  You can tell that you are  exercising at a moderate intensity if your heart starts beating faster and you start breathing faster but can still hold a conversation. Moderate-intensity exercise ideas include: Walking 1 mile (1.6 km) in about 15 minutes Biking Hiking Golfing Dancing Water aerobics Patient will verbalize understanding of everyday activities that increase physical activity by providing examples like the following: Yard work, such as: Sales promotion account executive Gardening Washing windows or floors Patient will be able to explain general safety guidelines for exercising:  Before you start a new exercise program, talk with your health care provider. Do not exercise so much that you hurt yourself, feel dizzy, or get very short of breath. Wear comfortable clothes and wear shoes with good support. Drink plenty of water while you exercise to prevent dehydration or heat stroke. Work out until your breathing and your heartbeat get faster.        Personalized Health Maintenance & Screening Recommendations  Pneumococcal vaccine  Influenza vaccine Td vaccine Shingles vaccine  Patient declined all the vaccines at this time.   Lung Cancer Screening Recommended: no (Low Dose CT Chest recommended if Age 24-80 years, 30 pack-year currently smoking OR have quit w/in past 15 years) Hepatitis C Screening recommended: yes HIV Screening recommended: no  Advanced Directives: Written information was prepared per patient's request.  Referrals & Orders No orders of the defined types were placed in this encounter.   Follow-up Plan Follow-up with Barbara Reeve, DO as planned Medicare wellness visit in one year. Patient will access AVS on my chart.   I have personally reviewed and noted the following in the patient's chart:  Medical and social history Use of alcohol, tobacco or illicit drugs  Current medications and  supplements Functional ability and status Nutritional status Physical activity Advanced directives List of other physicians Hospitalizations, surgeries, and ER visits in previous 12 months Vitals Screenings to include cognitive, depression, and falls Referrals and appointments  In addition, I have reviewed and discussed with Barbara Simpson certain preventive protocols, quality metrics, and best practice recommendations. A written personalized care plan for preventive services as well as general preventive health recommendations is available and can be mailed to the patient at her request.      Tinnie Gens, RN  03/27/2021

## 2021-03-27 NOTE — Patient Instructions (Addendum)
[employer] Maintenance Summary and Written Plan of Care  Ms. Bounds ,  Thank you for allowing me to perform your Medicare Annual Wellness Visit and for your ongoing commitment to your health.   Health Maintenance & Immunization History Health Maintenance  Topic Date Due  . COVID-19 Vaccine (4 - Booster for [employer] series) 04/12/2021 (Originally 11/12/2020)  . INFLUENZA VACCINE  05/13/2021 (Originally 03/18/2021)  . Zoster Vaccines- Shingrix (1 of 2) 06/27/2021 (Originally 03/25/1960)  . TETANUS/TDAP  03/27/2022 (Originally 08/18/2020)  . Hepatitis C Screening  03/27/2022 (Originally 03/25/1959)  . PNA vac Low Risk Adult (1 of 2 - PCV13) 03/27/2022 (Originally 03/25/2008)  . DEXA SCAN  Completed  . HPV VACCINES  Aged Out   Immunization History  Administered Date(s) Administered  . Fluad Quad(high Dose 65+) 05/04/2019  . Influenza, High Dose Seasonal PF 06/15/2018  . Influenza-Unspecified 07/07/2018, 07/04/2020  . PFIZER(Purple Top)SARS-COV-2 Vaccination 10/09/2019, 11/02/2019, 08/14/2020  . Tdap 08/18/2010    These are the patient goals that we discussed:  Goals Addressed               This Visit's Progress   .  Patient Stated (pt-stated)        03/27/2021 AWV Goal: Exercise for General Health  Patient will verbalize understanding of the benefits of increased physical activity: Exercising regularly is important. It will improve your overall fitness, flexibility, and endurance. Regular exercise also will improve your overall health. It can help you control your weight, reduce stress, and improve your bone density. Over the next year, patient will increase physical activity as tolerated with a goal of at least 150 minutes of moderate physical activity per week.  You can tell that you are exercising at a moderate intensity if your heart starts beating faster and you start breathing faster but can still hold a conversation. Moderate-intensity exercise ideas  include: Walking 1 mile (1.6 km) in about 15 minutes Biking Hiking Golfing Dancing Water aerobics Patient will verbalize understanding of everyday activities that increase physical activity by providing examples like the following: Yard work, such as: Sales promotion account executive Gardening Washing windows or floors Patient will be able to explain general safety guidelines for exercising:  Before you start a new exercise program, talk with your health care provider. Do not exercise so much that you hurt yourself, feel dizzy, or get very short of breath. Wear comfortable clothes and wear shoes with good support. Drink plenty of water while you exercise to prevent dehydration or heat stroke. Work out until your breathing and your heartbeat get faster.          This is a list of Health Maintenance Items that are overdue or due now: Pneumococcal vaccine  Influenza vaccine Td vaccine Shingles vaccine  Patient declined all the vaccines at this time.   Orders/Referrals Placed Today: No orders of the defined types were placed in this encounter.  (Contact our referral department at [phone] if you have not spoken with someone about your referral appointment within the next 5 days)    Follow-up Plan Follow-up with Emeterio Reeve, DO as planned Medicare wellness visit in one year. Patient will access AVS on my chart.      Health Maintenance, Female Adopting a healthy lifestyle and getting preventive care are important in promoting health and wellness. Ask your health care provider about: The right schedule for you to have regular tests and exams.  Things you can do on your own to prevent diseases and keep yourself healthy. What should I know about diet, weight, and exercise? Eat a healthy diet  Eat a diet that includes plenty of vegetables, fruits, low-fat dairy products, and lean protein. Do not  eat a lot of foods that are high in solid fats, added sugars, or sodium.  Maintain a healthy weight Body mass index (BMI) is used to identify weight problems. It estimates body fat based on height and weight. Your health care provider can help determineyour BMI and help you achieve or maintain a healthy weight. Get regular exercise Get regular exercise. This is one of the most important things you can do for your health. Most adults should: Exercise for at least 150 minutes each week. The exercise should increase your heart rate and make you sweat (moderate-intensity exercise). Do strengthening exercises at least twice a week. This is in addition to the moderate-intensity exercise. Spend less time sitting. Even light physical activity can be beneficial. Watch cholesterol and blood lipids Have your blood tested for lipids and cholesterol at 78 years of age, then havethis test every 5 years. Have your cholesterol levels checked more often if: Your lipid or cholesterol levels are high. You are older than 78 years of age. You are at high risk for heart disease. What should I know about cancer screening? Depending on your health history and family history, you may need to have cancer screening at various ages. This may include screening for: Breast cancer. Cervical cancer. Colorectal cancer. Skin cancer. Lung cancer. What should I know about heart disease, diabetes, and high blood pressure? Blood pressure and heart disease High blood pressure causes heart disease and increases the risk of stroke. This is more likely to develop in people who have high blood pressure readings, are of African descent, or are overweight. Have your blood pressure checked: Every 3-5 years if you are 75-63 years of age. Every year if you are 57 years old or older. Diabetes Have regular diabetes screenings. This checks your fasting blood sugar level. Have the screening done: Once every three years after age 50 if  you are at a normal weight and have a low risk for diabetes. More often and at a younger age if you are overweight or have a high risk for diabetes. What should I know about preventing infection? Hepatitis B If you have a higher risk for hepatitis B, you should be screened for this virus. Talk with your health care provider to find out if you are at risk forhepatitis B infection. Hepatitis C Testing is recommended for: Everyone born from [birth year] through 1963. Anyone with known risk factors for hepatitis C. Sexually transmitted infections (STIs) Get screened for STIs, including gonorrhea and chlamydia, if: You are sexually active and are younger than 78 years of age. You are older than 78 years of age and your health care provider tells you that you are at risk for this type of infection. Your sexual activity has changed since you were last screened, and you are at increased risk for chlamydia or gonorrhea. Ask your health care provider if you are at risk. Ask your health care provider about whether you are at high risk for HIV. Your health care provider may recommend a prescription medicine to help prevent HIV infection. If you choose to take medicine to prevent HIV, you should first get tested for HIV. You should then be tested every 3 months for as long as you are taking  the medicine. Pregnancy If you are about to stop having your period (premenopausal) and you may become pregnant, seek counseling before you get pregnant. Take 400 to 800 micrograms (mcg) of folic acid every day if you become pregnant. Ask for birth control (contraception) if you want to prevent pregnancy. Osteoporosis and menopause Osteoporosis is a disease in which the bones lose minerals and strength with aging. This can result in bone fractures. If you are 19 years old or older, or if you are at risk for osteoporosis and fractures, ask your health care provider if you should: Be screened for bone loss. Take a calcium or  vitamin D supplement to lower your risk of fractures. Be given hormone replacement therapy (HRT) to treat symptoms of menopause. Follow these instructions at home: Lifestyle Do not use any products that contain nicotine or tobacco, such as cigarettes, e-cigarettes, and chewing tobacco. If you need help quitting, ask your health care provider. Do not use street drugs. Do not share needles. Ask your health care provider for help if you need support or information about quitting drugs. Alcohol use Do not drink alcohol if: Your health care provider tells you not to drink. You are pregnant, may be pregnant, or are planning to become pregnant. If you drink alcohol: Limit how much you use to 0-1 drink a day. Limit intake if you are breastfeeding. Be aware of how much alcohol is in your drink. In the [place] one drink equals one 12 oz bottle of beer (355 mL), one 5 oz glass of wine (148 mL), or one 1 oz glass of hard liquor (44 mL). General instructions Schedule regular health, dental, and eye exams. Stay current with your vaccines. Tell your health care provider if: You often feel depressed. You have ever been abused or do not feel safe at home. Summary Adopting a healthy lifestyle and getting preventive care are important in promoting health and wellness. Follow your health care provider's instructions about healthy diet, exercising, and getting tested or screened for diseases. Follow your health care provider's instructions on monitoring your cholesterol and blood pressure. This information is not intended to replace advice given to you by your health care provider. Make sure you discuss any questions you have with your healthcare provider. Document Revised: 07/28/2018 Document Reviewed: 07/28/2018 Elsevier Patient Education  2022 [employer].

## 2021-04-02 ENCOUNTER — Telehealth: Payer: Self-pay | Admitting: Gastroenterology

## 2021-04-02 NOTE — Telephone Encounter (Signed)
Inbound call from patient stating she is still experiencing changes in her bowels.  Please advise.

## 2021-04-02 NOTE — Telephone Encounter (Signed)
Spoke with pattient. Patient states for 4 months she has been having issues with her bowel movementrs. Reports she was prescribed Trulance, took one pill and had a "blow out," and stopped taking it. Patient denies any abd pain, fever, or diet change.States she just has heaviness feeling. Patient req rec's please advise.  7/15 ASSESSMENT AND PLAN;    #1. Chronic constipation. Nl TSH. Neg multiple colonoscopies most recently 2019   #2. LLQ pain. ?Etiology.   Plan: -Mg '400mg'$  po QOD (Nl  renal function) -CT AP with PO IV contrast.  Per radiology check CMP before. -Trulance samples.  1 tab po qd -Continue to drink plenty of water. -Start walking 30 min/day. -FU thereafter.

## 2021-04-08 ENCOUNTER — Telehealth: Payer: Self-pay | Admitting: Podiatry

## 2021-04-08 NOTE — Telephone Encounter (Signed)
Restart MiraLAX 17 g p.o. once a day Take Trulance once per week -on Sundays for now Let us know how she is in 2 weeks RG

## 2021-04-08 NOTE — Telephone Encounter (Signed)
LVM on home and cell for patient to call back

## 2021-04-08 NOTE — Telephone Encounter (Signed)
Patient wants to know if there is something else she could take instead of trulance? Shestated that she wont take the trulance once a week because she only took one pill before and she said when she did she lost call control of her bowels and it was splattering every where and was going constantly and that she felt really weak once it was all over. She said she is willing to restart the miralax but doesn't think it will help her out.

## 2021-04-08 NOTE — Telephone Encounter (Signed)
Adjusted orthotics in.. pt aware ok to pick up.Marland KitchenMarland Kitchen

## 2021-04-10 ENCOUNTER — Telehealth: Payer: Self-pay | Admitting: Podiatry

## 2021-04-10 NOTE — Telephone Encounter (Signed)
Pt left message stating she went to [place] office to pick up her orthotics yesterday and no one was there. She wants to pick up in [place] office..  I returned call and explained that Dr Jacqualyn Posey is on ly in [place] on Friday am and I could give to lisa to take to [facility] office for her to pick up this Friday. She said that would be great.

## 2021-04-10 NOTE — Telephone Encounter (Signed)
Patient stated that she will look up the medication and that she just the miralax today and have done magnesium citrate and have her suppositories for now. Told her to call us in 2 weeks with an update and we can sent in the new medication at that point if she wants to see about trying it

## 2021-04-10 NOTE — Telephone Encounter (Signed)
LVM for patient to call back. ?

## 2021-04-10 NOTE — Telephone Encounter (Signed)
Lets continue MiraLAX and see what it does Otherwise, we will call in Amitiza 24 mcg twice daily with meals RG

## 2021-04-26 NOTE — Telephone Encounter (Signed)
Patient came by the [place] office to pick up orthotics. Lattie Haw

## 2021-04-26 NOTE — Telephone Encounter (Signed)
Pt left message stating she went to [place] office to pick up her orthotics yesterday and no one was there. She wants to pick up in [place] office.Marland Kitchen  She wanted to know when she could come?

## 2021-05-10 ENCOUNTER — Ambulatory Visit: Payer: Medicare Other | Admitting: Podiatry

## 2021-05-17 ENCOUNTER — Other Ambulatory Visit: Payer: Self-pay | Admitting: Osteopathic Medicine

## 2021-05-20 ENCOUNTER — Other Ambulatory Visit: Payer: Self-pay

## 2021-05-20 ENCOUNTER — Ambulatory Visit (INDEPENDENT_AMBULATORY_CARE_PROVIDER_SITE_OTHER): Payer: Medicare Other | Admitting: Sports Medicine

## 2021-05-20 DIAGNOSIS — M19072 Primary osteoarthritis, left ankle and foot: Secondary | ICD-10-CM | POA: Diagnosis not present

## 2021-05-20 DIAGNOSIS — S90934A Unspecified superficial injury of right lesser toe(s), initial encounter: Secondary | ICD-10-CM | POA: Diagnosis not present

## 2021-05-20 NOTE — Assessment & Plan Note (Signed)
Kicked a bed frame, had pain, swelling, bruising and right third toe, on exam tenderness at the MTP and PIP. Buddy tape second and third toes, return as needed.   No x-rays per patient request.

## 2021-05-20 NOTE — Progress Notes (Signed)
    Procedures performed today:    None.  Independent interpretation of notes and tests performed by another provider:   None.  Brief History, Exam, Impression, and Recommendations:    Injury of toe, right, superficial Kicked a bed frame, had pain, swelling, bruising and right third toe, on exam tenderness at the MTP and PIP. Buddy tape second and third toes, return as needed.   No x-rays per patient request.  Primary osteoarthritis of left foot Barbara Simpson returns, she did have a calcaneocuboid joint injection back in March of last year, did well until recently, I do think she fractured her toe so we will hold off on any injections for a month, she can return in a month and if the foot/ankle are still hurting we can repeat calcaneocuboid joint injection.    ___________________________________________ Gwen Her. Dianah Field, M.D., ABFM., CAQSM. Primary Care and [place] Instructor of [employer] [employer] [facility]

## 2021-05-20 NOTE — Assessment & Plan Note (Signed)
Barbara Simpson returns, she did have a calcaneocuboid joint injection back in March of last year, did well until recently, I do think she fractured her toe so we will hold off on any injections for a month, she can return in a month and if the foot/ankle are still hurting we can repeat calcaneocuboid joint injection.

## 2021-08-02 ENCOUNTER — Other Ambulatory Visit: Payer: Self-pay

## 2021-08-02 ENCOUNTER — Ambulatory Visit (INDEPENDENT_AMBULATORY_CARE_PROVIDER_SITE_OTHER): Payer: Medicare Other

## 2021-08-02 ENCOUNTER — Ambulatory Visit (INDEPENDENT_AMBULATORY_CARE_PROVIDER_SITE_OTHER): Payer: Medicare Other | Admitting: Sports Medicine

## 2021-08-02 DIAGNOSIS — M19072 Primary osteoarthritis, left ankle and foot: Secondary | ICD-10-CM | POA: Diagnosis not present

## 2021-08-02 DIAGNOSIS — S90934D Unspecified superficial injury of right lesser toe(s), subsequent encounter: Secondary | ICD-10-CM | POA: Diagnosis not present

## 2021-08-02 NOTE — Assessment & Plan Note (Signed)
Right third toe still with some discomfort at the DIP this time, no longer painful at the MTP and PIP as before. She did decline x-rays at the last visit. We can just watch this for now though she does have what appear to be Bouchard and Heberden's nodes suggestive of osteoarthritis.

## 2021-08-02 NOTE — Progress Notes (Signed)
° ° °  Procedures performed today:    Procedure: Real-time Ultrasound Guided injection of the left peroneal tendon sheath Device: Samsung HS60  Verbal informed consent obtained.  Time-out conducted.  Noted no overlying erythema, induration, or other signs of local infection.  Skin prepped in a sterile fashion.  Local anesthesia: Topical Ethyl chloride.  With sterile technique and under real time ultrasound guidance: Noted trace effusion, 1 cc Kenalog 40, 1 cc lidocaine, 1 cc bupivacaine injected easily Completed without difficulty  Advised to call if fevers/chills, erythema, induration, drainage, or persistent bleeding.  Images permanently stored and available for review in PACS.  Impression: Technically successful ultrasound guided injection.  Independent interpretation of notes and tests performed by another provider:   None.  Brief History, Exam, Impression, and Recommendations:    Primary osteoarthritis of left foot Barbara Simpson returns, she is a pleasant 80 year old female, known midfoot osteoarthritis, calcaneocuboid injection back in March 2021 seem to work well. She still has some discomfort at the calcaneocuboid joint but the majority of Simpson discomfort is over the peroneals, moderate swelling, reproduced with resisted eversion. Per Simpson request we did a peroneal tendon sheath injection, adding some home conditioning exercises, if no better in 6 weeks we will do an MRI.  Injury of toe, right, superficial Right third toe still with some discomfort at the DIP this time, no longer painful at the MTP and PIP as before. She did decline x-rays at the last visit. We can just watch this for now though she does have what appear to be Bouchard and Heberden's nodes suggestive of osteoarthritis.    ___________________________________________ Barbara Simpson. Barbara Simpson, M.D., ABFM., CAQSM. Primary Care and [place] Instructor of [place] of [facility] of Medicine

## 2021-08-02 NOTE — Assessment & Plan Note (Signed)
Barbara Simpson returns, she is a pleasant 80 year old female, known midfoot osteoarthritis, calcaneocuboid injection back in March 2021 seem to work well. She still has some discomfort at the calcaneocuboid joint but the majority of her discomfort is over the peroneals, moderate swelling, reproduced with resisted eversion. Per her request we did a peroneal tendon sheath injection, adding some home conditioning exercises, if no better in 6 weeks we will do an MRI.

## 2021-08-15 ENCOUNTER — Other Ambulatory Visit: Payer: Self-pay

## 2021-08-15 MED ORDER — CARVEDILOL 12.5 MG PO TABS: 12.5000 mg | ORAL_TABLET | Freq: Two times a day (BID) | ORAL | 1 refills | Status: DC

## 2021-08-15 NOTE — Telephone Encounter (Signed)
Fax received from [facility] regarding a refill on patient's carvedilol. Patient has not established care with another provider since alexander's departure from the office.

## 2021-09-20 ENCOUNTER — Other Ambulatory Visit: Payer: Self-pay

## 2021-09-20 ENCOUNTER — Ambulatory Visit (INDEPENDENT_AMBULATORY_CARE_PROVIDER_SITE_OTHER): Payer: Medicare Other | Admitting: Medical-Surgical

## 2021-09-20 ENCOUNTER — Encounter: Payer: Self-pay | Admitting: Medical-Surgical

## 2021-09-20 VITALS — BP 113/69 | HR 84 | Resp 20 | Ht 64.0 in | Wt 154.0 lb

## 2021-09-20 DIAGNOSIS — E538 Deficiency of other specified B group vitamins: Secondary | ICD-10-CM

## 2021-09-20 DIAGNOSIS — Z7689 Persons encountering health services in other specified circumstances: Secondary | ICD-10-CM

## 2021-09-20 DIAGNOSIS — R002 Palpitations: Secondary | ICD-10-CM | POA: Diagnosis not present

## 2021-09-20 DIAGNOSIS — E559 Vitamin D deficiency, unspecified: Secondary | ICD-10-CM

## 2021-09-20 DIAGNOSIS — R5383 Other fatigue: Secondary | ICD-10-CM

## 2021-09-20 DIAGNOSIS — Z0000 Encounter for general adult medical examination without abnormal findings: Secondary | ICD-10-CM

## 2021-09-20 MED ORDER — CARVEDILOL 12.5 MG PO TABS: 12.5000 mg | ORAL_TABLET | Freq: Two times a day (BID) | ORAL | 1 refills | Status: AC

## 2021-09-20 NOTE — Progress Notes (Signed)
°  HPI with pertinent ROS:   CC: Transfer care  HPI: Pleasant 81 year old female presenting today to transfer care to new PCP and for the following:  Hypertension-she is not checking blood pressures at home and admits that she does add salt to her diet due to chronic hyponatremia.  She is taking carvedilol 12.5 mg twice daily as prescribed, tolerating well without side effects.  She has been on this medication long-term and feels that it still works well for her. Denies CP, SOB, palpitations, lower extremity edema, dizziness, headaches, or vision changes.  Fatigue-has noted an increase in her fatigue lately and would like to have labs drawn.  She is undergoing a moved to [place] soon and is just closed on a house today.  Notes an increase in overall stress with the planned move to may be contributing to her fatigue.  I reviewed the past medical history, family history, social history, surgical history, and allergies today and no changes were needed.  Please see the problem list section below in epic for further details.   Physical exam:   General: Well Developed, well nourished, and in no acute distress.  Neuro: Alert and oriented x3.  HEENT: Normocephalic, atraumatic.  Skin: Warm and dry. Cardiac: Regular rate and rhythm, no murmurs rubs or gallops, no lower extremity edema.  Respiratory: Clear to auscultation bilaterally. Not using accessory muscles, speaking in full sentences.  Impression and Recommendations:    1. Encounter to establish care Reviewed available information and discussed care concerns with patient.   2. Palpitations Well-controlled with use of carvedilol 12.5 mg twice daily.  Continue prescription as instructed.  Refill sent to pharmacy.  3. B12 deficiency Continue oral supplementation.  Checking vitamin B12 level today.  4. Vitamin D deficiency Continue oral supplementation.  Checking vitamin D today.  5. Healthcare maintenance Checking CBC, CMP, and lipid  panel today.  6. Fatigue, unspecified type Checking labs today including TSH although this is followed by endocrinology.  Likely related to stress of the upcoming move but we will evaluate for other etiologies.  Return in about 6 months (around 03/20/2022) for chronic disease follow up. ___________________________________________ Clearnce Sorrel, DNP, APRN, FNP-BC Primary Care and [place]

## 2021-09-21 LAB — CBC WITH DIFFERENTIAL/PLATELET
Absolute Monocytes: 520 cells/uL (ref 200–950)
Basophils Absolute: 32 cells/uL (ref 0–200)
Basophils Relative: 0.4 %
Eosinophils Absolute: 160 cells/uL (ref 15–500)
Eosinophils Relative: 2 %
HCT: 40.1 % (ref 35.0–45.0)
Hemoglobin: 13.2 g/dL (ref 11.7–15.5)
Lymphs Abs: 1480 cells/uL (ref 850–3900)
MCH: 27.5 pg (ref 27.0–33.0)
MCHC: 32.9 g/dL (ref 32.0–36.0)
MCV: 83.5 fL (ref 80.0–100.0)
MPV: 10.2 fL (ref 7.5–12.5)
Monocytes Relative: 6.5 %
Neutro Abs: 5808 cells/uL (ref 1500–7800)
Neutrophils Relative %: 72.6 %
Platelets: 317 10*3/uL (ref 140–400)
RBC: 4.8 10*6/uL (ref 3.80–5.10)
RDW: 13.7 % (ref 11.0–15.0)
Total Lymphocyte: 18.5 %
WBC: 8 10*3/uL (ref 3.8–10.8)

## 2021-09-21 LAB — COMPLETE METABOLIC PANEL WITH GFR
AG Ratio: 1.9 (calc) (ref 1.0–2.5)
ALT: 10 U/L (ref 6–29)
AST: 12 U/L (ref 10–35)
Albumin: 4.2 g/dL (ref 3.6–5.1)
Alkaline phosphatase (APISO): 71 U/L (ref 37–153)
BUN: 11 mg/dL (ref 7–25)
CO2: 31 mmol/L (ref 20–32)
Calcium: 9.1 mg/dL (ref 8.6–10.4)
Chloride: 93 mmol/L — ABNORMAL LOW (ref 98–110)
Creat: 0.71 mg/dL (ref 0.60–1.00)
Globulin: 2.2 g/dL (calc) (ref 1.9–3.7)
Glucose, Bld: 125 mg/dL — ABNORMAL HIGH (ref 65–99)
Potassium: 3.9 mmol/L (ref 3.5–5.3)
Sodium: 132 mmol/L — ABNORMAL LOW (ref 135–146)
Total Bilirubin: 0.4 mg/dL (ref 0.2–1.2)
Total Protein: 6.4 g/dL (ref 6.1–8.1)
eGFR: 87 mL/min/{1.73_m2} (ref >=60)

## 2021-09-21 LAB — LIPID PANEL
Cholesterol: 227 mg/dL — ABNORMAL HIGH (ref <200)
HDL: 60 mg/dL (ref >=50)
LDL Cholesterol (Calc): 134 mg/dL (calc) — ABNORMAL HIGH
Non-HDL Cholesterol (Calc): 167 mg/dL (calc) — ABNORMAL HIGH (ref <130)
Total CHOL/HDL Ratio: 3.8 (calc) (ref <5.0)
Triglycerides: 193 mg/dL — ABNORMAL HIGH (ref <150)

## 2021-09-21 LAB — IRON,TIBC AND FERRITIN PANEL
%SAT: 17 % (calc) (ref 16–45)
Ferritin: 24 ng/mL (ref 16–288)
Iron: 54 ug/dL (ref 45–160)
TIBC: 325 mcg/dL (calc) (ref 250–450)

## 2021-09-21 LAB — VITAMIN B12: Vitamin B-12: >2000 pg/mL — ABNORMAL HIGH (ref 200–1100)

## 2021-09-21 LAB — TSH: TSH: 1.78 mIU/L (ref 0.40–4.50)

## 2021-09-21 LAB — VITAMIN D 25 HYDROXY (VIT D DEFICIENCY, FRACTURES): Vit D, 25-Hydroxy: 53 ng/mL (ref 30–100)

## 2021-09-26 ENCOUNTER — Encounter: Payer: Medicare Other | Admitting: Family

## 2021-09-26 ENCOUNTER — Encounter: Payer: Self-pay | Admitting: Medical-Surgical

## 2021-09-26 ENCOUNTER — Other Ambulatory Visit: Payer: Medicare Other

## 2021-09-26 DIAGNOSIS — E782 Mixed hyperlipidemia: Secondary | ICD-10-CM

## 2021-09-26 MED ORDER — PRAVASTATIN SODIUM 10 MG PO TABS: 10.0000 mg | ORAL_TABLET | Freq: Every day | ORAL | 1 refills | Status: DC

## 2021-09-27 ENCOUNTER — Encounter: Payer: Medicare Other | Admitting: Family

## 2021-09-27 ENCOUNTER — Other Ambulatory Visit: Payer: Medicare Other

## 2021-09-29 IMAGING — MR MR CERVICAL SPINE W/O CM
6 series · 40 of 48 positions shown · non-contrast
Comparison: Cervical spine radiographs 12/23/2018

CLINICAL DATA: Neck pain.  History of breast and thyroid cancer.

EXAM:
MRI CERVICAL SPINE WITHOUT CONTRAST
TECHNIQUE: Multiplanar, multisequence MR imaging of the cervical spine was
performed. No intravenous contrast was administered.

[Series 5: T1 · sagittal · 3.0mm · 0.86mm/px · 6 of 13 slices shown]
[im 1/13]
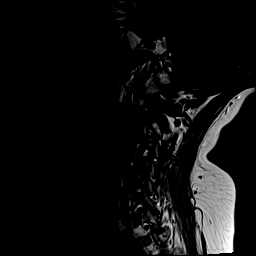
[im 3/13]
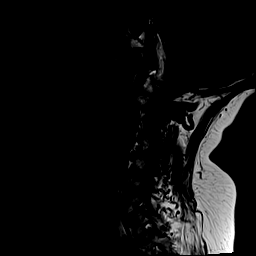
[im 5/13]
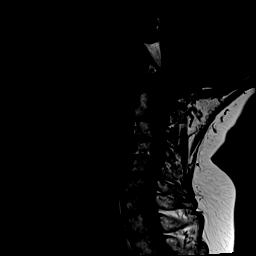
[im 8/13]
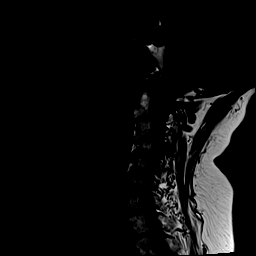
[im 10/13]
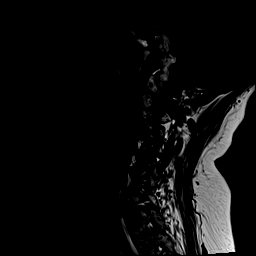
[im 13/13]
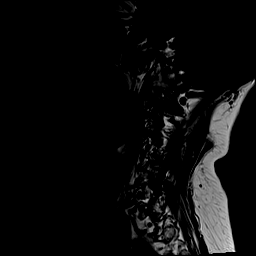

[Series 6: T2 · sagittal · 3.0mm · 0.69mm/px · 6 of 13 slices shown (1 of 2)]
[im 1/13]
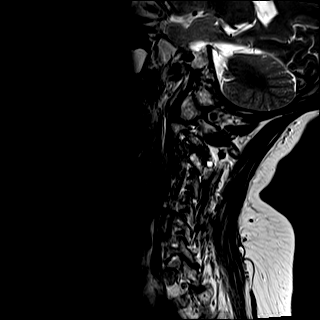
[im 3/13]
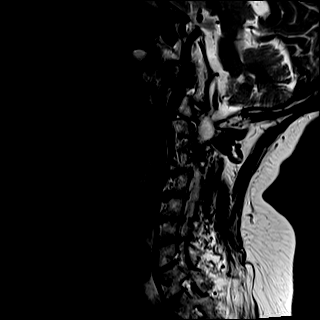
[im 5/13]
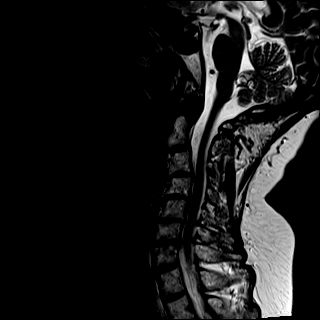
[im 8/13]
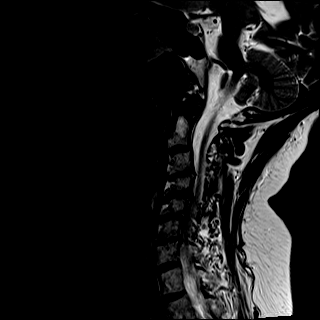
[im 10/13]
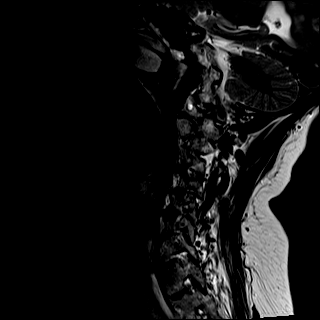
[im 13/13]
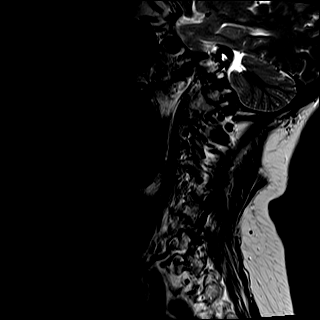

[Series 7: STIR · sagittal · 3.0mm · 0.69mm/px · 6 of 13 slices shown (1 of 2)]
[im 1/13]
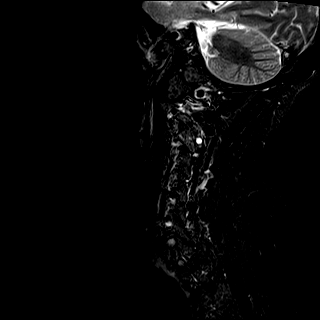
[im 3/13]
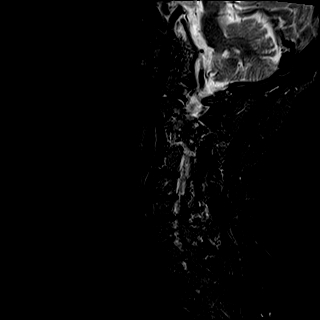
[im 5/13]
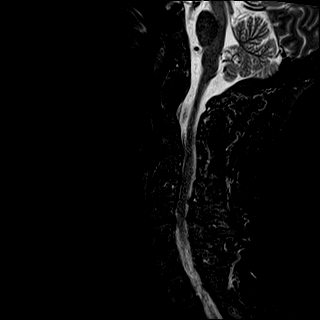
[im 8/13]
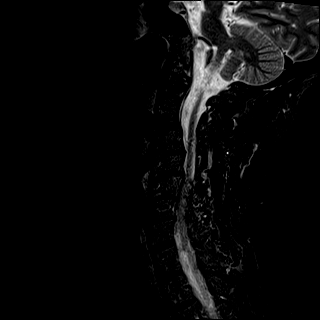
[im 10/13]
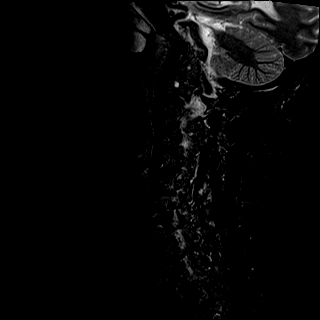
[im 13/13]
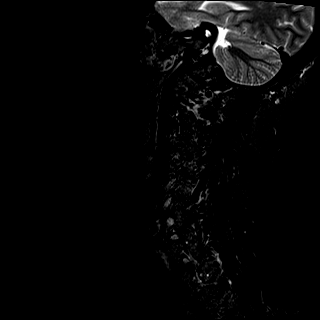

[Series 8: T2 · axial · 3.0mm · 0.62mm/px · z∈[-74,+19]mm · 9 of 27 slices shown (2 of 2)]
[im 1/27]
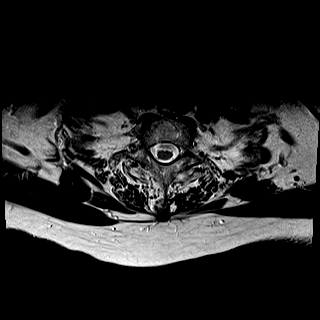
[im 5/27]
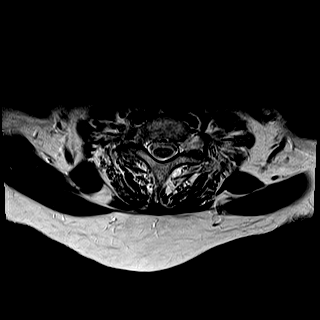
[im 8/27]
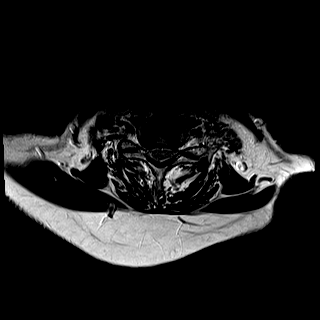
[im 12/27]
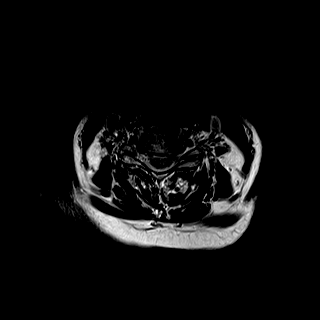
[im 15/27]
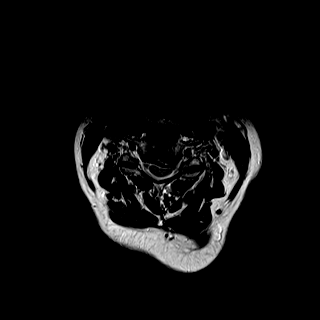
[im 19/27]
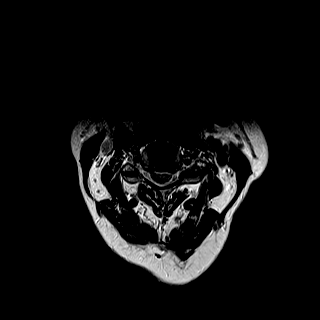
[im 22/27]
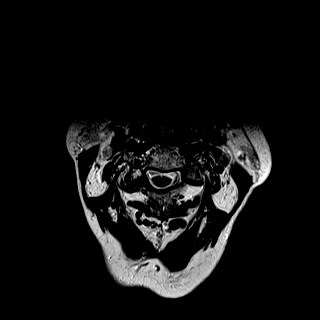
[im 24/27]
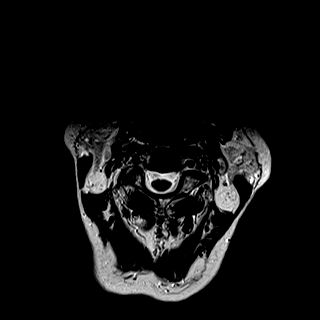
[im 27/27]
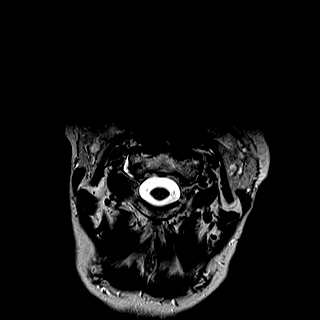

[Series 9: mpgr ax · axial · 3.0mm · 0.35mm/px · z∈[-61,+14]mm · 7 of 27 slices shown]
[im 1/27]
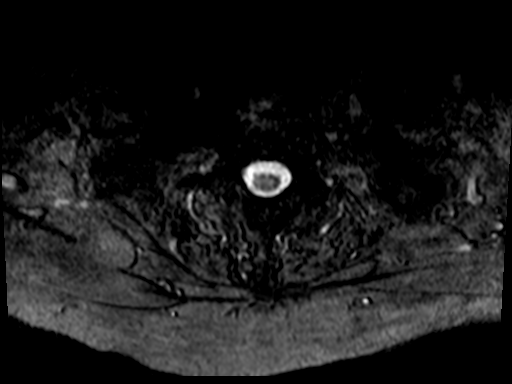
[im 5/27]
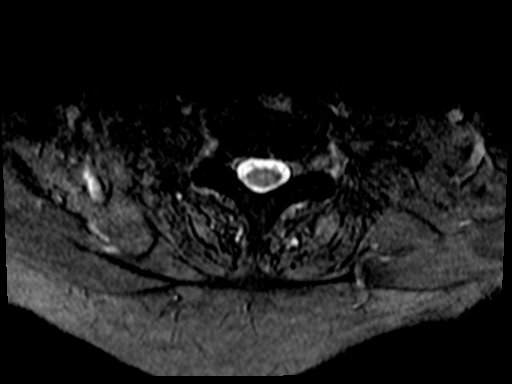
[im 8/27]
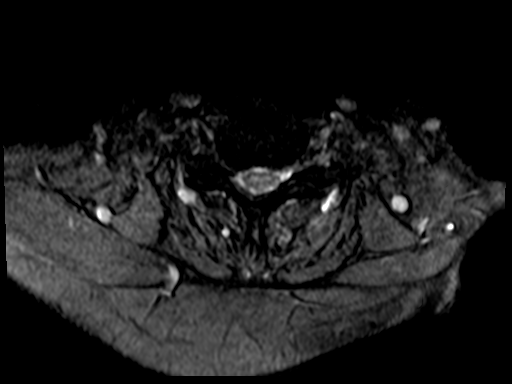
[im 12/27]
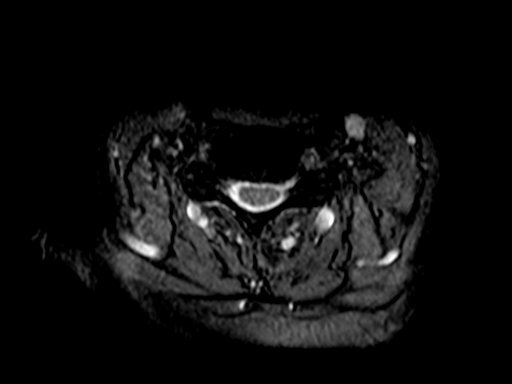
[im 15/27]
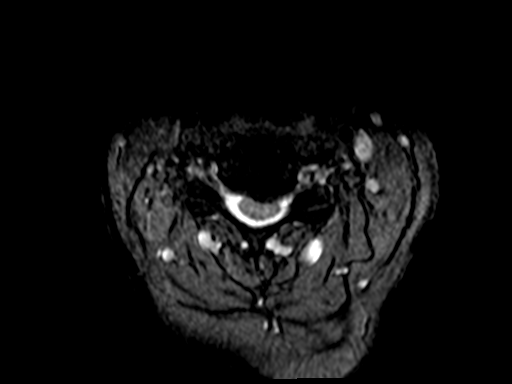
[im 19/27]
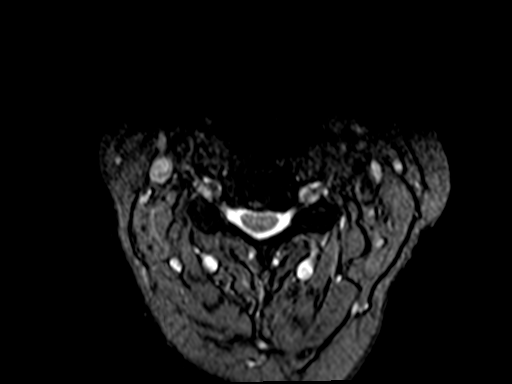
[im 22/27]
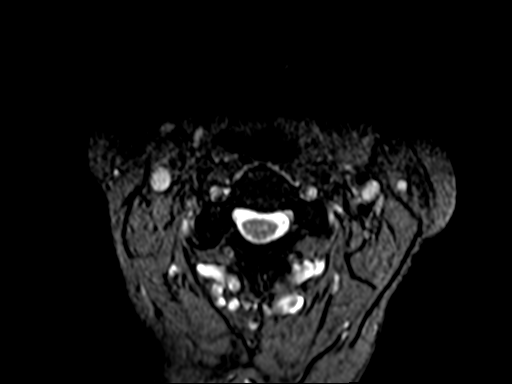

[Series 10: STIR · sagittal · 3.0mm · 0.69mm/px · 6 of 13 slices shown (2 of 2)]
[im 1/13]
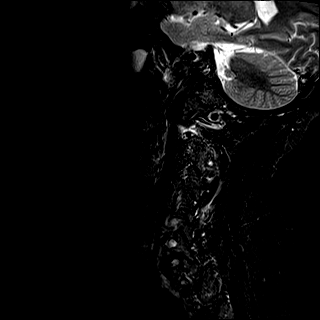
[im 3/13]
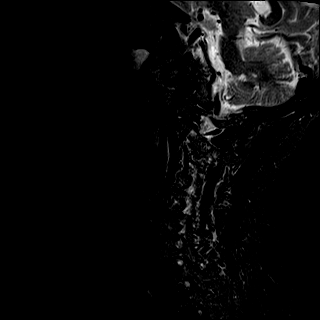
[im 5/13]
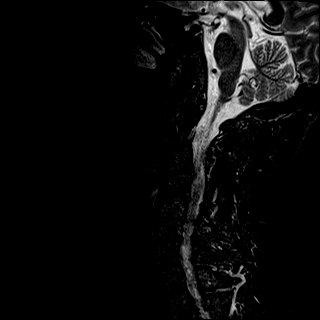
[im 8/13]
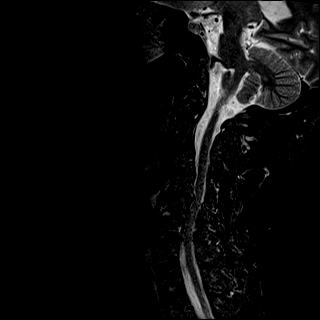
[im 10/13]
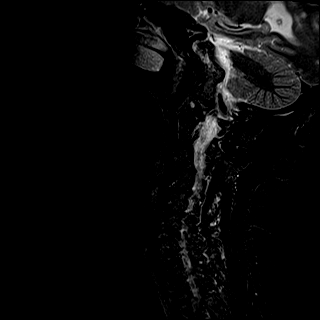
[im 13/13]
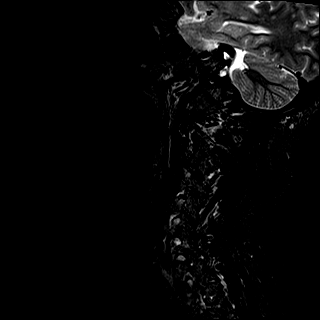

[40 of 48 positions shown; findings below may reference images not displayed]

FINDINGS: Alignment: Reversal of the normal cervical lordosis. 2 mm
anterolisthesis of C2 on C3 and C3 on C4.

Vertebrae: No fracture suspicious marrow lesion. Severe disc space
narrowing from C3-4 to C6-7 with mild degenerative endplate changes.

Cord: Normal signal.

Posterior Fossa, vertebral arteries, paraspinal tissues: Partially
visualized polypoid mucosal thickening or mucous retention cyst in
the right maxillary sinus. Preserved vertebral artery flow voids.

Disc levels:

C2-3: Anterolisthesis with disc uncovering and severe right facet
arthrosis without stenosis.

C3-4: Anterolisthesis with disc uncovering, uncovertebral spurring,
and moderate left facet arthrosis result in borderline spinal
stenosis and mild left neural foraminal stenosis.

C4-5: Left paracentral disc osteophyte complex results in mild
spinal stenosis with mild cord flattening. Uncovertebral spurring
results in mild-to-moderate left neural foraminal stenosis.

C5-6: Broad-based posterior disc osteophyte complex and mild facet
arthrosis result in mild spinal stenosis and moderate right and mild
left neural foraminal stenosis.

C6-7: Broad-based posterior disc osteophyte complex results in
borderline spinal stenosis and moderate right and mild left neural
foraminal stenosis.

C7-T1: Minimal uncovertebral spurring and right facet arthrosis
without stenosis.
IMPRESSION: Diffuse cervical disc degeneration with mild multilevel spinal
stenosis and up to moderate neural foraminal stenosis as above.

## 2021-09-30 ENCOUNTER — Inpatient Hospital Stay (HOSPITAL_BASED_OUTPATIENT_CLINIC_OR_DEPARTMENT_OTHER): Payer: Medicare Other | Admitting: Family

## 2021-09-30 ENCOUNTER — Other Ambulatory Visit: Payer: Self-pay

## 2021-09-30 ENCOUNTER — Encounter: Payer: Self-pay | Admitting: Family

## 2021-09-30 ENCOUNTER — Inpatient Hospital Stay: Payer: Medicare Other | Attending: Family

## 2021-09-30 VITALS — BP 151/63 | HR 81 | Temp 98.0°F | Resp 17 | Ht 64.0 in | Wt 154.0 lb

## 2021-09-30 DIAGNOSIS — Z853 Personal history of malignant neoplasm of breast: Secondary | ICD-10-CM | POA: Diagnosis present

## 2021-09-30 DIAGNOSIS — Z9223 Personal history of estrogen therapy: Secondary | ICD-10-CM | POA: Insufficient documentation

## 2021-09-30 DIAGNOSIS — C50912 Malignant neoplasm of unspecified site of left female breast: Secondary | ICD-10-CM

## 2021-09-30 DIAGNOSIS — Z170 Estrogen receptor positive status [ER+]: Secondary | ICD-10-CM | POA: Diagnosis not present

## 2021-09-30 DIAGNOSIS — Z9013 Acquired absence of bilateral breasts and nipples: Secondary | ICD-10-CM | POA: Insufficient documentation

## 2021-09-30 NOTE — Progress Notes (Signed)
Hematology and Oncology Follow Up Visit  Barbara Simpson [id number] [birth year] 81 y.o. 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 She is moving with her husband to [place] in March to be closer to family.  She continues to do well and has no complaints at this time.  No adenopathy or lymphedema noted on exam.  No fever, chills, n/v, cough, rash, dizziness, SOB, chest pain, palpitations, abdominal pain or changes in bowel or bladder habits.  No blood loss, bruising or petechiae.  No swelling, tenderness, numbness or tingling in her extremities at this time.   No falls or syncope.  She is eating well and staying properly hydrated. Her weight is stable at 154 lbs.   ECOG Performance Status: 0 - Asymptomatic  Medications:  Allergies as of 09/30/2021       Reactions   Cymbalta [duloxetine Hcl] Palpitations   Lidocaine Hcl Other (See Comments)   Patient stated, "nearly died"   Lidocaine-epinephrine Other (See Comments)   Patient states "nearly died"   Paroxetine Palpitations   Penicillins Other (See Comments)   Headache, blood pressure drops   Risedronate Other (See Comments)   Skin growths   Sulfa Antibiotics Rash, Other (See Comments)   Other Other (See Comments)   Uncoded Allergy. Allergen: Xylocaine with epinephrine        Medication List        Accurate as of September 30, 2021 11:59 PM. If you have any questions, ask your nurse or doctor.          STOP taking these medications    calcium carbonate 600 MG Tabs tablet Commonly known as: OS-CAL Stopped by: Lottie Dawson, NP   docusate sodium 100 MG capsule Commonly known as: COLACE Stopped by: Lottie Dawson, NP   Magnesium 400 MG  Tabs Stopped by: Lottie Dawson, NP   magnesium hydroxide 400 MG/5ML suspension Commonly known as: MILK OF MAGNESIA Stopped by: Lottie Dawson, NP   pravastatin 10 MG tablet Commonly known as: PRAVACHOL Stopped by: Lottie Dawson, NP       TAKE these medications    acetaminophen 500 MG tablet Commonly known as: TYLENOL Take 500 mg by mouth every 6 (six) hours as needed.   carvedilol 12.5 MG tablet Commonly known as: COREG Take 1 tablet (12.5 mg total) by mouth 2 (two) times daily with a meal.   Fish Oil 500 MG Caps Take 1 capsule by mouth daily.   fluticasone 50 MCG/ACT nasal spray Commonly known as: Flonase One spray in each nostril twice a day, use left hand for right nostril, and right hand for left nostril.   Omeprazole 20 MG Tbec Take 1 tablet (20 mg total) by mouth daily. What changed: additional instructions   SOMBRA NATURAL PAIN RELIEVING EX Apply topically.   Synthroid 50 MCG tablet Generic drug: levothyroxine Take by mouth. On sundays, she takes 150mcg.   vitamin B-12 500 MCG tablet Commonly known as: CYANOCOBALAMIN Take by mouth.   Vitamin D3 50 MCG (2000 UT) Tabs Take 50 mcg by mouth daily. What changed: additional instructions        Allergies:  Allergies  Allergen Reactions   Cymbalta [Duloxetine Hcl]  Palpitations   Lidocaine Hcl Other (See Comments)    Patient stated, "nearly died"    Lidocaine-Epinephrine Other (See Comments)    Patient states "nearly died"   Paroxetine Palpitations   Penicillins Other (See Comments)    Headache, blood pressure drops   Risedronate Other (See Comments)    Skin growths    Sulfa Antibiotics Rash and Other (See Comments)   Other Other (See Comments)    Uncoded Allergy. Allergen: Xylocaine with epinephrine    Past Medical History, Surgical history, Social history, and Family History were reviewed and updated.  Review of Systems: All other 10 point review of systems is negative.   Physical Exam:   height is 5\' 4"  (1.626 m) and weight is 154 lb (69.9 kg). Her oral temperature is 98 F (36.7 C). Her blood pressure is 151/63 (abnormal) and her pulse is 81. Her respiration is 17 and oxygen saturation is 98%.   Wt Readings from Last 3 Encounters:  09/30/21 154 lb (69.9 kg)  09/20/21 154 lb (69.9 kg)  03/01/21 154 lb 2 oz (69.9 kg)    Ocular: Sclerae unicteric, pupils equal, round and reactive to light Ear-nose-throat: Oropharynx clear, dentition fair Lymphatic: No cervical, supraclavicular or axillary adenopathy Lungs no rales or rhonchi, good excursion bilaterally Heart regular rate and rhythm, no murmur appreciated Abd soft, nontender, positive bowel sounds, no liver ot spleen tip palpated on exam, no fluid wave  MSK no focal spinal tenderness, no joint edema Neuro: non-focal, well-oriented, appropriate affect Breasts: No changes. Bilateral mastectomies with reconstruction.   Lab Results  Component Value Date   WBC 8.0 09/20/2021   HGB 13.2 09/20/2021   HCT 40.1 09/20/2021   MCV 83.5 09/20/2021   PLT 317 09/20/2021   Lab Results  Component Value Date   FERRITIN 24 09/20/2021   IRON 54 09/20/2021   TIBC 325 09/20/2021   IRONPCTSAT 17 09/20/2021   Lab Results  Component Value Date   RBC 4.80 09/20/2021   No results found for: KPAFRELGTCHN, LAMBDASER, KAPLAMBRATIO No results found for: IGGSERUM, IGA, IGMSERUM No results found for: TOTALPROTELP, ALBUMINELP, A1GS, Lenoria Farrier, GAMS, MSPIKE, SPEI   Chemistry      Component Value Date/Time   NA 132 (L) 09/20/2021 0000   K 3.9 09/20/2021 0000   CL 93 (L) 09/20/2021 0000   CO2 31 09/20/2021 0000   BUN 11 09/20/2021 0000   CREATININE 0.71 09/20/2021 0000      Component Value Date/Time   CALCIUM 9.1 09/20/2021 0000   ALKPHOS 69 09/26/2020 1046   AST 12 09/20/2021 0000   AST 10 (L) 09/26/2020 1046   ALT 10 09/20/2021 0000   ALT 7 09/26/2020 1046   BILITOT 0.4 09/20/2021 0000   BILITOT 0.5 09/26/2020 1046        Impression and Plan: Barbara Simpson is a very pleasant 81 yo caucasian female with history of left breast cancer with bilateral mastectomies in 2014 with saline implants.   She completed 5 years of Arimidex in January 2020.  She continues to do well and will be moving to [place] next month.  We are honored to have been a part of her care team. She will let us know where to forward any records.  She can contact our office with any questions or concerns.   Lottie Dawson, NP 2/14/202311:02 PM

## 2021-10-02 ENCOUNTER — Telehealth: Payer: Self-pay | Admitting: *Deleted

## 2021-10-02 NOTE — Telephone Encounter (Signed)
Per 09/30/21 los - No follow-up needed. Patient moving to [place] next month.

## 2022-04-09 ENCOUNTER — Encounter: Payer: Self-pay | Admitting: General Practice

## 2024-04-19 ENCOUNTER — Encounter: Payer: Self-pay | Admitting: Sports Medicine
